# Patient Record
Sex: Male | Born: 1965 | Hispanic: Refuse to answer | Marital: Married | Smoking: Never smoker
Health system: Southern US, Community
[De-identification: ages and names within clinical notes are randomized; demographics above are authoritative.]

## PROBLEM LIST (undated history)

## (undated) DIAGNOSIS — I10 Essential (primary) hypertension: Secondary | ICD-10-CM

## (undated) DIAGNOSIS — L719 Rosacea, unspecified: Secondary | ICD-10-CM

## (undated) DIAGNOSIS — E785 Hyperlipidemia, unspecified: Secondary | ICD-10-CM

## (undated) DIAGNOSIS — J309 Allergic rhinitis, unspecified: Secondary | ICD-10-CM

## (undated) HISTORY — DX: Rosacea, unspecified: L71.9

## (undated) HISTORY — DX: Essential (primary) hypertension: I10

## (undated) HISTORY — DX: Hyperlipidemia, unspecified: E78.5

## (undated) HISTORY — PX: GUM SURGERY: SHX658

## (undated) HISTORY — DX: Allergic rhinitis, unspecified: J30.9

---

## 2004-09-12 ENCOUNTER — Ambulatory Visit (HOSPITAL_COMMUNITY): Admission: RE | Admit: 2004-09-12 | Discharge: 2004-09-12 | Payer: Self-pay | Admitting: Cardiovascular Disease

## 2012-11-24 HISTORY — PX: VASECTOMY: SHX75

## 2015-06-04 ENCOUNTER — Other Ambulatory Visit: Payer: Self-pay | Admitting: Family Medicine

## 2015-06-04 DIAGNOSIS — Z0000 Encounter for general adult medical examination without abnormal findings: Secondary | ICD-10-CM

## 2015-06-13 ENCOUNTER — Other Ambulatory Visit: Payer: Self-pay | Admitting: Family Medicine

## 2015-06-13 DIAGNOSIS — I10 Essential (primary) hypertension: Secondary | ICD-10-CM

## 2015-06-13 DIAGNOSIS — E785 Hyperlipidemia, unspecified: Secondary | ICD-10-CM

## 2015-06-15 ENCOUNTER — Encounter: Payer: Self-pay | Admitting: Family Medicine

## 2015-06-15 ENCOUNTER — Ambulatory Visit (INDEPENDENT_AMBULATORY_CARE_PROVIDER_SITE_OTHER): Payer: BC Managed Care – PPO | Admitting: Family Medicine

## 2015-06-15 VITALS — BP 116/74 | HR 94 | Temp 98.6°F | Resp 16 | Ht 66.0 in | Wt 186.8 lb

## 2015-06-15 DIAGNOSIS — I10 Essential (primary) hypertension: Secondary | ICD-10-CM | POA: Insufficient documentation

## 2015-06-15 DIAGNOSIS — Z0000 Encounter for general adult medical examination without abnormal findings: Secondary | ICD-10-CM

## 2015-06-15 DIAGNOSIS — E785 Hyperlipidemia, unspecified: Secondary | ICD-10-CM | POA: Diagnosis not present

## 2015-06-15 DIAGNOSIS — L719 Rosacea, unspecified: Secondary | ICD-10-CM | POA: Insufficient documentation

## 2015-06-15 DIAGNOSIS — J309 Allergic rhinitis, unspecified: Secondary | ICD-10-CM | POA: Insufficient documentation

## 2015-06-15 NOTE — Progress Notes (Signed)
Subjective:     Patient ID: Jesse Lara, male   DOB: 09-19-1966, 49 y.o.   MRN: [id number]  HPI  Chief Complaint  Patient presents with  . Annual Exam    patient is present in office today for his yearly exam he has no questions or concerns today.   He has already had labs drawn prior to this visit with further review today.   Review of Systems  General: Feeling well HEENT: regular dental visits and eye exams.Sees ENT, Dr. Willeen Cass, for allergy shots. Cardiovascular: no chest pain, shortness of breath, or palpitations GI: no heartburn, no change in bowel habits or blood in the stool GU: nocturia x 1, no change in bladder habits  Neuro: no change in memory Psychiatric: not depressed-PHQ-2: 0 Musculoskeletal: no joint pain Skin: sees dermatology for treatment of rosacea and annual survey.    Objective:   Physical Exam Eyes: PERRLA Neck: no thyromegaly, tenderness or nodules ENT: TM's intact without inflammation; No tonsillar enlargement or exudate, Lungs: Clear Heart : RRR without murmur or gallop Abd: bowel sounds present, soft, non-tender, no organomegaly Extremities: no edema     Assessment:    1. Annual physical exam  2. Essential hypertension-continue current medication   3. HLD (hyperlipidemia)-mild, low 10 year cardiovascular disease risk (2.5%)    Plan:    Continue daily exercise.

## 2015-06-15 NOTE — Patient Instructions (Signed)
Continue regular exercise

## 2015-06-22 ENCOUNTER — Telehealth: Payer: Self-pay | Admitting: Family Medicine

## 2015-06-22 NOTE — Telephone Encounter (Signed)
Pt is calling to give a name of a Rx/face cream-Finacea Acid foam 15%/MW

## 2015-06-22 NOTE — Telephone Encounter (Signed)
See below-aa 

## 2015-08-21 ENCOUNTER — Telehealth: Payer: Self-pay | Admitting: Family Medicine

## 2015-08-21 ENCOUNTER — Encounter: Payer: Self-pay | Admitting: Family Medicine

## 2015-08-21 ENCOUNTER — Ambulatory Visit (INDEPENDENT_AMBULATORY_CARE_PROVIDER_SITE_OTHER): Payer: BC Managed Care – PPO | Admitting: Family Medicine

## 2015-08-21 VITALS — BP 116/64 | HR 97 | Temp 98.5°F | Resp 16 | Wt 177.4 lb

## 2015-08-21 DIAGNOSIS — M545 Low back pain, unspecified: Secondary | ICD-10-CM

## 2015-08-21 MED ORDER — OXYCODONE HCL 5 MG PO TABS: 5.0000 mg | ORAL_TABLET | Freq: Four times a day (QID) | ORAL | Status: DC | PRN

## 2015-08-21 NOTE — Progress Notes (Addendum)
Subjective:     Patient ID: Jesse Lara, male   DOB: 1966/03/23, 49 y.o.   MRN: [id number]  HPI  Chief Complaint  Patient presents with  . Follow-up    Patient is present in office today for hospital follow up. Patient was seen at [facility] ER with complaints of lower back pain on the right side. CT scan patient reports did show small kidney stones but was not related to patients pain. Patient reports that he has been taking Ibuprofen/Tylenol, diazepam, and Lidoderm patches for pain with no relief.   States he was felt to have low back strain. [facility] records reviewed from 9/25-9/26/16. Reports he did do some lifting and other outside yard work prior to his pain worsening.    Review of Systems  Constitutional: Negative for fever and chills.  Musculoskeletal:       No radiation of pain or prior back injury reported.  Neurological:       Occasional mild tingling to right inner thigh area       Objective:   Physical Exam  Constitutional: He appears well-developed and well-nourished. He appears distressed (moderate pain with changing postions. Lying on his side on presentation).  Musculoskeletal:  Muscle strength in lower extremities 5/5. SLR's to 90 degrees without radiation of back pain.  localizes to his right upper buttock area. Mildly tender to the touch.     Assessment:    1. Right-sided low back pain without sciatica - oxyCODONE (ROXICODONE) 5 MG immediate release tablet; Take 1 tablet (5 mg total) by mouth every 6 (six) hours as needed for severe pain.  Dispense: 28 tablet; Refill: 0    Plan:    Discussed continued use of Ibuprofen, tylenol, and diazepam. Oxycodone provided for breakthrough pain. Work excuse for 9/26-9/30 provided.

## 2015-08-21 NOTE — Telephone Encounter (Signed)
Pt is requesting a call back.  Pt was seen in the ER at [facility] on Sunday and wants to speak to a nurse before making an appointment.  CB#[phone]/MW

## 2015-08-21 NOTE — Patient Instructions (Signed)
Continue ibuprofen (ideally with meals) and Tylenol up to 3000 mg/day. May use diazepam for muscle spasm. Use narcotic pain medication for pain not relieved by the above.

## 2015-08-21 NOTE — Telephone Encounter (Signed)
Spoke with patient on the phone who states that he was seen at [facility] on Sunday. Patient reports having back pain and Ct scan was performed it did not show any signs of a kidney stone. Patient was prescribed medication from ER and states that it is not helping him and wants to know if there was anything else that we can do, advised patient to come in office for hospital follow up for further evaluation and work up. Patient declined and states that he will be going back to [facility] to be examined again. KW

## 2015-08-27 ENCOUNTER — Ambulatory Visit (INDEPENDENT_AMBULATORY_CARE_PROVIDER_SITE_OTHER): Payer: BC Managed Care – PPO | Admitting: Family Medicine

## 2015-08-27 ENCOUNTER — Encounter: Payer: Self-pay | Admitting: Family Medicine

## 2015-08-27 VITALS — BP 116/60 | HR 90 | Temp 98.2°F | Resp 16 | Wt 174.2 lb

## 2015-08-27 DIAGNOSIS — Z0289 Encounter for other administrative examinations: Secondary | ICD-10-CM

## 2015-08-27 DIAGNOSIS — Z23 Encounter for immunization: Secondary | ICD-10-CM | POA: Diagnosis not present

## 2015-08-27 NOTE — Progress Notes (Signed)
Subjective:     Patient ID: Jesse Lara, male   DOB: 05/15/1966, 49 y.o.   MRN: [id number]  HPI Chief Complaint  Patient presents with  . Work Form    Needs form to be fill out to go back to work  States his back feels much better and he is tapering back use of medication.   Review of Systems     Objective:   Physical Exam  Constitutional: He appears well-developed and well-nourished. No distress.  Musculoskeletal:  Able to move and change positions without difficulty. SLR's to 90 degrees without back pain or radiation of pain.       Assessment:    1. Encounter for completion of form with patient  2. Need for influenza vaccinatio - Flu Vaccine QUAD 36+ mos IM    Plan:    Form completed and will be faxed today.

## 2015-08-27 NOTE — Patient Instructions (Signed)
We will fax your form today. 

## 2015-08-29 ENCOUNTER — Encounter: Payer: Self-pay | Admitting: Family Medicine

## 2015-08-30 ENCOUNTER — Other Ambulatory Visit: Payer: Self-pay | Admitting: Family Medicine

## 2015-08-30 DIAGNOSIS — I10 Essential (primary) hypertension: Secondary | ICD-10-CM

## 2015-08-30 MED ORDER — LISINOPRIL-HYDROCHLOROTHIAZIDE 20-25 MG PO TABS: 1.0000 | ORAL_TABLET | Freq: Every day | ORAL | Status: DC

## 2015-09-05 ENCOUNTER — Other Ambulatory Visit: Payer: Self-pay | Admitting: Family Medicine

## 2016-08-25 ENCOUNTER — Encounter: Payer: Self-pay | Admitting: Family Medicine

## 2016-08-25 ENCOUNTER — Ambulatory Visit (INDEPENDENT_AMBULATORY_CARE_PROVIDER_SITE_OTHER): Payer: BC Managed Care – PPO | Admitting: Family Medicine

## 2016-08-25 VITALS — BP 106/60 | HR 94 | Temp 98.3°F | Resp 16 | Ht 66.0 in | Wt 182.8 lb

## 2016-08-25 DIAGNOSIS — E785 Hyperlipidemia, unspecified: Secondary | ICD-10-CM | POA: Diagnosis not present

## 2016-08-25 DIAGNOSIS — I10 Essential (primary) hypertension: Secondary | ICD-10-CM

## 2016-08-25 MED ORDER — LISINOPRIL-HYDROCHLOROTHIAZIDE 20-25 MG PO TABS
1.0000 | ORAL_TABLET | Freq: Every day | ORAL | 3 refills | Status: DC
Start: 2016-08-25 — End: 2017-08-10

## 2016-08-25 NOTE — Progress Notes (Signed)
Subjective:     Patient ID: Jesse Lara, male   DOB: 09/07/1966, 50 y.o.   MRN: [id number]  HPI  Chief Complaint  Patient presents with  . Annual Exam    Patient comes in office today for his annual physical he states that he has no questions or conerns toay. Patients last reported Tdap 08/28/22 he is due today for flu vaccine but has declined.   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 Continues to walk 4 days a week for about an hour. States he developed cold sx about 3 days ago.   Review of Systems General: Feeling well except for current URI. HEENT: regular dental visits and eye exams Cardiovascular: no chest pain, shortness of breath, or palpitations GI: no heartburn, no change in bowel habits or blood in the stool GU: nocturia x 0, no change in bladder habits  Psychiatric: not depressed Musculoskeletal: no joint pain    Objective:   Physical Exam  Constitutional: He appears well-developed and well-nourished. No distress.  Eyes: PERRLA Neck: no thyromegaly, tenderness or nodules, no cervical adenopathy ENT: TM's intact without inflammation; No tonsillar enlargement or exudate, Lungs: Clear Heart : RRR without murmur or gallop Abd: bowel sounds present, soft, non-tender, no organomegaly Extremities: no edema     Assessment:    1. Essential hypertension - Comprehensive metabolic panel - lisinopril-hydrochlorothiazide (PRINZIDE,ZESTORETIC) 20-25 MG tablet; Take 1 tablet by mouth daily.  Dispense: 90 tablet; Refill: 3  2. Hyperlipidemia, unspecified hyperlipidemia type - Lipid panel    Plan:    Further f/u pending lab work. Continue walking program

## 2016-08-25 NOTE — Patient Instructions (Signed)
We will call you with the lab results. Continue walking program.

## 2016-08-26 ENCOUNTER — Telehealth: Payer: Self-pay

## 2016-08-26 LAB — COMPREHENSIVE METABOLIC PANEL
ALT: 42 IU/L (ref 0–44)
AST: 25 IU/L (ref 0–40)
Albumin/Globulin Ratio: 1.6 (ref 1.2–2.2)
Albumin: 4.6 g/dL (ref 3.5–5.5)
Alkaline Phosphatase: 88 IU/L (ref 39–117)
BUN/Creatinine Ratio: 9 (ref 9–20)
BUN: 8 mg/dL (ref 6–24)
Bilirubin Total: 1 mg/dL (ref 0.0–1.2)
CO2: 24 mmol/L (ref 18–29)
Calcium: 9.7 mg/dL (ref 8.7–10.2)
Chloride: 95 mmol/L — ABNORMAL LOW (ref 96–106)
Creatinine, Ser: 0.9 mg/dL (ref 0.76–1.27)
GFR calc Af Amer: 116 mL/min/{1.73_m2} (ref >59)
GFR calc non Af Amer: 101 mL/min/{1.73_m2} (ref >59)
Globulin, Total: 2.9 g/dL (ref 1.5–4.5)
Glucose: 101 mg/dL — ABNORMAL HIGH (ref 65–99)
Potassium: 4.3 mmol/L (ref 3.5–5.2)
Sodium: 137 mmol/L (ref 134–144)
Total Protein: 7.5 g/dL (ref 6.0–8.5)

## 2016-08-26 LAB — LIPID PANEL
Chol/HDL Ratio: 5.1 ratio units — ABNORMAL HIGH (ref 0.0–5.0)
Cholesterol, Total: 185 mg/dL (ref 100–199)
HDL: 36 mg/dL — ABNORMAL LOW (ref >39)
LDL Calculated: 122 mg/dL — ABNORMAL HIGH (ref 0–99)
Triglycerides: 134 mg/dL (ref 0–149)
VLDL Cholesterol Cal: 27 mg/dL (ref 5–40)

## 2016-08-26 NOTE — Telephone Encounter (Signed)
-----   Message from Anola Gurneyobert Chauvin, [place] sent at 08/26/2016  7:39 AM EDT ----- Labs ok. Your calculated 10 year risk for developing cardiovascular disease is 3%. We usually will recommend cholesterol lowering drugs at a 7.5% or greater.

## 2016-08-26 NOTE — Telephone Encounter (Signed)
Pt advised.   Thanks,   -Barbera Perritt  

## 2016-09-01 ENCOUNTER — Encounter: Payer: Self-pay | Admitting: Physician Assistant

## 2016-09-01 ENCOUNTER — Ambulatory Visit (INDEPENDENT_AMBULATORY_CARE_PROVIDER_SITE_OTHER): Payer: BC Managed Care – PPO | Admitting: Physician Assistant

## 2016-09-01 VITALS — BP 118/64 | HR 96 | Temp 98.2°F | Resp 16 | Wt 179.0 lb

## 2016-09-01 DIAGNOSIS — J40 Bronchitis, not specified as acute or chronic: Secondary | ICD-10-CM

## 2016-09-01 DIAGNOSIS — R05 Cough: Secondary | ICD-10-CM | POA: Diagnosis not present

## 2016-09-01 DIAGNOSIS — R059 Cough, unspecified: Secondary | ICD-10-CM

## 2016-09-01 MED ORDER — BENZONATATE 100 MG PO CAPS
100.0000 mg | ORAL_CAPSULE | Freq: Three times a day (TID) | ORAL | 0 refills | Status: DC | PRN
Start: 2016-09-01 — End: 2016-09-10

## 2016-09-01 NOTE — Progress Notes (Signed)
[facility]  Chief Complaint  Patient presents with  . Cough    Subjective:    Patient ID: Jesse Lara, male    DOB: 03-20-1966, 50 y.o.   MRN: [id number]  Upper Respiratory Infection: Jesse Lara is a  50 y.o. male with a past medical history significant for Hypertension, Hyperlipidemia, and Allergic Rhinitis complaining of symptoms of a URI. Symptoms include cough. Onset of symptoms was 1 week ago, unchanged since that time. He also c/o congestion, headache described as a sinus headache, nasal congestion, post nasal drip, productive cough with  yellow colored sputum and purulent nasal discharge for the past 1 week .  He is drinking plenty of fluids. Evaluation to date: none. Treatment to date: Pt reports having "old Augmentin" that he is taking from home. . The treatment has provided minimal.   Review of Systems  Constitutional: Positive for fatigue. Negative for activity change, appetite change, chills, diaphoresis, fever and unexpected weight change.  HENT: Positive for congestion, ear pain (ear fullness), postnasal drip, rhinorrhea, sinus pressure and sore throat. Negative for ear discharge, facial swelling, nosebleeds, sneezing, tinnitus, trouble swallowing and voice change.   Eyes: Negative.   Respiratory: Positive for cough and wheezing. Negative for apnea, choking, chest tightness, shortness of breath and stridor.   Cardiovascular: Negative.   Gastrointestinal: Negative.   Neurological: Positive for headaches. Negative for dizziness and light-headedness.   Patient reports that he took some leftover Augmentin 500/125 BID for six days prior to coming. He reports that his sinus symptoms have improved, but he has lingering post nasal drip and a cough that keeps him up at night. Denies fever, nausea, vomiting.     Objective:   BP 118/64 (BP Location: Left Arm, Patient Position: Sitting, Cuff Size: Normal)   Pulse 96   Temp 98.2 F (36.8 C) (Oral)   Resp 16   Wt  179 lb (81.2 kg)   SpO2 94%   BMI 28.89 kg/m   Patient Active Problem List   Diagnosis Date Noted  . Allergic rhinitis 06/15/2015  . BP (high blood pressure) 06/15/2015  . HLD (hyperlipidemia) 06/15/2015  . Rosacea 06/15/2015    Outpatient Encounter Prescriptions as of 09/01/2016  Medication Sig Note  . lisinopril-hydrochlorothiazide (PRINZIDE,ZESTORETIC) 20-25 MG tablet Take 1 tablet by mouth daily.   . MULTIPLE VITAMIN PO Take by mouth. 06/15/2015: Received from: [facility]  . OMEGA-3 FATTY ACIDS PO Take by mouth. 06/15/2015: Received from: [facility]  . benzonatate (TESSALON) 100 MG capsule Take 1 capsule (100 mg total) by mouth 3 (three) times daily as needed for cough.    No facility-administered encounter medications on file as of 09/01/2016.     Allergies  Allergen Reactions  . Codeine Nausea Only       Physical Exam  Constitutional: He is oriented to person, place, and time. Vital signs are normal. He appears well-developed and well-nourished. He does not appear ill. No distress.  HENT:  Right Ear: Tympanic membrane and external ear normal.  Left Ear: Tympanic membrane and external ear normal.  Nose: Rhinorrhea present. Right sinus exhibits no maxillary sinus tenderness and no frontal sinus tenderness. Left sinus exhibits no maxillary sinus tenderness and no frontal sinus tenderness.  Mouth/Throat: No oropharyngeal exudate.  Eyes: Right eye exhibits discharge. Left eye exhibits discharge.  Watery eye discharge.   Cardiovascular: Normal rate, regular rhythm and normal heart sounds.   Pulmonary/Chest: Effort normal and breath sounds normal. No respiratory distress. He  has no wheezes. He has no rales.  Lymphadenopathy:    He has cervical adenopathy.  Neurological: He is alert and oriented to person, place, and time.  Skin: Skin is warm and dry. He is not diaphoretic.  Psychiatric: He has a normal mood and affect. His behavior is  normal.       Assessment & Plan:   Problem List Items Addressed This Visit    None    Visit Diagnoses    Cough    -  Primary   Relevant Medications   benzonatate (TESSALON) 100 MG capsule   Bronchitis          Antibiotics are not appropriate at this time. Patient has improvement on sinus symptoms and is not concerning for pneumonia 2/2 normal respiratory effort, clear lung sounds, lack of systemic signs. Patient counseled that cough and post nasal drip can linger. Counseled to take coricidin for congestion. Prescribed Tessalon perles for cough. May take Benadryl at night. Counseled patient to return if fever > 100.4, nausea, vomiting, myalgias, productive cough worsens.  Recommend rest, fluids, frequent hand washing. Work note provided  Patient Instructions  Acute Bronchitis Bronchitis is inflammation of the airways that extend from the windpipe into the lungs (bronchi). The inflammation often causes mucus to develop. This leads to a cough, which is the most common symptom of bronchitis.  In acute bronchitis, the condition usually develops suddenly and goes away over time, usually in a couple weeks. Smoking, allergies, and asthma can make bronchitis worse. Repeated episodes of bronchitis may cause further lung problems.  CAUSES Acute bronchitis is most often caused by the same virus that causes a cold. The virus can spread from person to person (contagious) through coughing, sneezing, and touching contaminated objects. SIGNS AND SYMPTOMS   Cough.   Fever.   Coughing up mucus.   Body aches.   Chest congestion.   Chills.   Shortness of breath.   Sore throat.  DIAGNOSIS  Acute bronchitis is usually diagnosed through a physical exam. Your health care provider will also ask you questions about your medical history. Tests, such as chest X-rays, are sometimes done to rule out other conditions.  TREATMENT  Acute bronchitis usually goes away in a couple weeks.  Oftentimes, no medical treatment is necessary. Medicines are sometimes given for relief of fever or cough. Antibiotic medicines are usually not needed but may be prescribed in certain situations. In some cases, an inhaler may be recommended to help reduce shortness of breath and control the cough. A cool mist vaporizer may also be used to help thin bronchial secretions and make it easier to clear the chest.  HOME CARE INSTRUCTIONS  Get plenty of rest.   Drink enough fluids to keep your urine clear or pale yellow (unless you have a medical condition that requires fluid restriction). Increasing fluids may help thin your respiratory secretions (sputum) and reduce chest congestion, and it will prevent dehydration.   Take medicines only as directed by your health care provider.  If you were prescribed an antibiotic medicine, finish it all even if you start to feel better.  Avoid smoking and secondhand smoke. Exposure to cigarette smoke or irritating chemicals will make bronchitis worse. If you are a smoker, consider using nicotine gum or skin patches to help control withdrawal symptoms. Quitting smoking will help your lungs heal faster.   Reduce the chances of another bout of acute bronchitis by washing your hands frequently, avoiding people with cold symptoms, and trying not  to touch your hands to your mouth, nose, or eyes.   Keep all follow-up visits as directed by your health care provider.  SEEK MEDICAL CARE IF: Your symptoms do not improve after 1 week of treatment.  SEEK IMMEDIATE MEDICAL CARE IF:  You develop an increased fever or chills.   You have chest pain.   You have severe shortness of breath.  You have bloody sputum.   You develop dehydration.  You faint or repeatedly feel like you are going to pass out.  You develop repeated vomiting.  You develop a severe headache. MAKE SURE YOU:   Understand these instructions.  Will watch your condition.  Will get help  right away if you are not doing well or get worse.   This information is not intended to replace advice given to you by your health care provider. Make sure you discuss any questions you have with your health care provider.   Document Released: 12/18/2004 Document Revised: 12/01/2014 Document Reviewed: 05/03/2013 Elsevier Interactive Patient Education [employer]     The entirety of the information documented in the History of Present Illness, Review of Systems and Physical Exam were personally obtained by me. Portions of this information were initially documented by Kavin Leech, CMA and reviewed by me for thoroughness and accuracy.

## 2016-09-01 NOTE — Patient Instructions (Signed)

## 2016-09-10 ENCOUNTER — Encounter: Payer: Self-pay | Admitting: Family Medicine

## 2016-09-10 ENCOUNTER — Ambulatory Visit (INDEPENDENT_AMBULATORY_CARE_PROVIDER_SITE_OTHER): Payer: BC Managed Care – PPO | Admitting: Family Medicine

## 2016-09-10 VITALS — BP 124/70 | HR 98 | Temp 98.4°F | Resp 17 | Wt 181.8 lb

## 2016-09-10 DIAGNOSIS — B9789 Other viral agents as the cause of diseases classified elsewhere: Secondary | ICD-10-CM | POA: Diagnosis not present

## 2016-09-10 DIAGNOSIS — J069 Acute upper respiratory infection, unspecified: Secondary | ICD-10-CM

## 2016-09-10 MED ORDER — CEFDINIR 300 MG PO CAPS: 300.0000 mg | ORAL_CAPSULE | Freq: Two times a day (BID) | ORAL | 0 refills | Status: DC

## 2016-09-10 NOTE — Patient Instructions (Signed)
Continue Zyrtec and Neti Pot. Get Tessalon Perles for cough. Add Benadryl (diphenhydramine) at night. If increased yellow/green drainage start antibiotic.

## 2016-09-10 NOTE — Progress Notes (Signed)
Subjective:     Patient ID: Jesse Lara, male   DOB: 01/25/1966, 50 y.o.   MRN: [id number]  HPI  Chief Complaint  Patient presents with  . Nasal Congestion    Patient comes in office today with concerns of post nasal drainage since 08/22/16 patient states that drip is constant and has caused him to have a productive cough. Patient also reports sore throat, he states that he has been taking otc cough drops and Zyrtec.   He is two weeks out from start of URI sx. Did not get Tessalon Perles. Has been using Zyrtec and a Neti Pot. Has had primarily clear drainage but notes residual yellow drainage at times. States his kids are sick as well.   Review of Systems     Objective:   Physical Exam  Constitutional: He appears well-developed and well-nourished. No distress.  Ears: T.M's intact without inflammation Throat: no tonsillar enlargement or exudate Neck: no cervical adenopathy Lungs: clear     Assessment:    1. Viral upper respiratory tract infection - cefdinir (OMNICEF) 300 MG capsule; Take 1 capsule (300 mg total) by mouth 2 (two) times daily.  Dispense: 20 capsule; Refill: 0     Plan:    Continue Zyrtec and Neti pot. Add Benadryl at night and Tessalon Perles. Start abx if Increased purulent drainage.

## 2016-09-26 ENCOUNTER — Encounter: Payer: Self-pay | Admitting: Family Medicine

## 2016-09-26 ENCOUNTER — Telehealth: Payer: Self-pay

## 2016-09-26 NOTE — Telephone Encounter (Signed)
error 

## 2016-10-07 ENCOUNTER — Encounter: Payer: Self-pay | Admitting: Family Medicine

## 2016-10-07 ENCOUNTER — Other Ambulatory Visit: Payer: Self-pay | Admitting: Family Medicine

## 2016-10-07 DIAGNOSIS — Z125 Encounter for screening for malignant neoplasm of prostate: Secondary | ICD-10-CM

## 2016-10-09 ENCOUNTER — Other Ambulatory Visit: Payer: Self-pay | Admitting: Family Medicine

## 2016-10-10 ENCOUNTER — Telehealth: Payer: Self-pay

## 2016-10-10 ENCOUNTER — Encounter: Payer: Self-pay | Admitting: Family Medicine

## 2016-10-10 LAB — PSA: Prostate Specific Ag, Serum: 0.5 ng/mL (ref 0.0–4.0)

## 2016-10-10 NOTE — Telephone Encounter (Signed)
-----   Message from Anola Gurneyobert Chauvin, [place] sent at 10/10/2016  7:32 AM EST ----- Your PSA is ok. You can wait until 52 to recheck.

## 2016-10-10 NOTE — Telephone Encounter (Signed)
[id number] 

## 2016-10-13 NOTE — Telephone Encounter (Signed)
Patient has been advised. KW 

## 2016-10-13 NOTE — Telephone Encounter (Signed)
[id number] 

## 2017-03-02 ENCOUNTER — Encounter: Payer: Self-pay | Admitting: Family Medicine

## 2017-03-16 ENCOUNTER — Encounter: Payer: Self-pay | Admitting: Family Medicine

## 2017-03-19 ENCOUNTER — Ambulatory Visit (INDEPENDENT_AMBULATORY_CARE_PROVIDER_SITE_OTHER): Payer: BC Managed Care – PPO | Admitting: Family Medicine

## 2017-03-19 ENCOUNTER — Encounter: Payer: Self-pay | Admitting: Family Medicine

## 2017-03-19 VITALS — BP 122/84 | HR 80 | Temp 98.4°F | Resp 16 | Wt 187.6 lb

## 2017-03-19 DIAGNOSIS — S61219A Laceration without foreign body of unspecified finger without damage to nail, initial encounter: Secondary | ICD-10-CM

## 2017-03-19 NOTE — Progress Notes (Addendum)
Subjective:     Patient ID: Jesse Lara, male   DOB: 09/29/1966, 51 y.o.   MRN: [id number]  HPI  Chief Complaint  Patient presents with  . Laceration    Patient comes in office today with concerns of possible infection to wound. Patient states that on 03/14/17 he had scrapped his thumb [place] piece of [place]. Last reported Tdap was 08/29/2011.  Has a skin flap type laceration and has replaced the flap over the wound. Initially used hydrogen peroxide but has since been applying Neosporin abx and band aid. No drainage.   Review of Systems     Objective:   Physical Exam  Constitutional: He appears well-developed and well-nourished. No distress.  Skin:  Dorsum of left thumb with 1 cm dusky appearing skin flap laceration. Minimal tenderness without drainage or underlying induration on palpation.       Assessment:    1. Laceration of skin of finger, initial encounter: suspect non-viable skin flap     Plan:    Discussed cleaning with soap and water daily and applying abx ointment and bandaid as he has been doing (re-applied in the office). May debride flap if dry or dark. Call if purulent drainage or non-healing.

## 2017-03-19 NOTE — Patient Instructions (Signed)
Discussed cleansing wound with soap and water and apply antibiotic ointment and bandaid. You may cut off any tissue that does not appear viable (dried out/dark).

## 2017-04-24 ENCOUNTER — Encounter: Payer: Self-pay | Admitting: Family Medicine

## 2017-06-16 ENCOUNTER — Encounter: Payer: Self-pay | Admitting: Family Medicine

## 2017-06-30 ENCOUNTER — Encounter: Payer: Self-pay | Admitting: Family Medicine

## 2017-07-06 ENCOUNTER — Encounter: Payer: Self-pay | Admitting: Family Medicine

## 2017-07-13 ENCOUNTER — Encounter: Payer: Self-pay | Admitting: Family Medicine

## 2017-08-10 ENCOUNTER — Ambulatory Visit (INDEPENDENT_AMBULATORY_CARE_PROVIDER_SITE_OTHER): Payer: BC Managed Care – PPO | Admitting: Family Medicine

## 2017-08-10 ENCOUNTER — Encounter: Payer: Self-pay | Admitting: Family Medicine

## 2017-08-10 VITALS — BP 138/78 | HR 64 | Temp 98.8°F | Resp 16 | Ht 66.0 in | Wt 151.0 lb

## 2017-08-10 DIAGNOSIS — E782 Mixed hyperlipidemia: Secondary | ICD-10-CM

## 2017-08-10 DIAGNOSIS — Z0000 Encounter for general adult medical examination without abnormal findings: Secondary | ICD-10-CM

## 2017-08-10 LAB — COMPLETE METABOLIC PANEL WITH GFR
AG Ratio: 2.2 (calc) (ref 1.0–2.5)
ALT: 26 U/L (ref 9–46)
AST: 19 U/L (ref 10–40)
Albumin: 4.6 g/dL (ref 3.6–5.1)
Alkaline phosphatase (APISO): 76 U/L (ref 40–115)
BUN: 12 mg/dL (ref 7–25)
CO2: 30 mmol/L (ref 20–32)
Calcium: 9.4 mg/dL (ref 8.6–10.3)
Chloride: 106 mmol/L (ref 98–110)
Creat: 0.83 mg/dL (ref 0.60–1.35)
GFR, Est African American: 120 mL/min/{1.73_m2} (ref >=60)
GFR, Est Non African American: 103 mL/min/{1.73_m2} (ref >=60)
Globulin: 2.1 g/dL (calc) (ref 1.9–3.7)
Glucose, Bld: 91 mg/dL (ref 65–99)
Potassium: 4 mmol/L (ref 3.5–5.3)
Sodium: 142 mmol/L (ref 135–146)
Total Bilirubin: 1 mg/dL (ref 0.2–1.2)
Total Protein: 6.7 g/dL (ref 6.1–8.1)

## 2017-08-10 LAB — LIPID PANEL
Cholesterol: 146 mg/dL (ref <200)
HDL: 48 mg/dL (ref >40)
LDL Cholesterol (Calc): 84 mg/dL (calc)
Non-HDL Cholesterol (Calc): 98 mg/dL (calc) (ref <130)
Total CHOL/HDL Ratio: 3 (calc) (ref <5.0)
Triglycerides: 64 mg/dL (ref <150)
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
## 2017-08-10 NOTE — Patient Instructions (Signed)
We will call you with the lab results. Continue with your impressive lifestyle changes. Let me know if your blood pressure approaches 140/90. Come in to recheck your blood pressure in 6 months

## 2017-09-24 ENCOUNTER — Encounter: Payer: Self-pay | Admitting: Family Medicine

## 2017-09-25 ENCOUNTER — Other Ambulatory Visit: Payer: Self-pay | Admitting: Family Medicine

## 2017-09-25 ENCOUNTER — Telehealth: Payer: Self-pay | Admitting: Family Medicine

## 2017-09-25 ENCOUNTER — Encounter: Payer: Self-pay | Admitting: Family Medicine

## 2017-09-25 MED ORDER — LISINOPRIL 10 MG PO TABS: 10.0000 mg | ORAL_TABLET | Freq: Every day | ORAL | 0 refills | Status: DC

## 2017-09-25 NOTE — Telephone Encounter (Signed)
Pt is requesting Nadine CountsBob return his call. Pt stated he would like to discuss his BP with Nadine CountsBob. Pt wouldn't give more details. Pt stated that Nadine CountsBob would know because he has been a pt of Bob's for 20 years. Please advise. Thanks TNP

## 2017-09-25 NOTE — Telephone Encounter (Signed)
Please review-Jesse Lara Jesse Lara, RMA  

## 2017-09-25 NOTE — Telephone Encounter (Signed)
Blood pressure elevated despite significant exercise and weight loss Will send in lisinopril 10 mg. With further f/u in two weeks.

## 2017-10-20 ENCOUNTER — Encounter: Payer: Self-pay | Admitting: Family Medicine

## 2017-10-20 ENCOUNTER — Other Ambulatory Visit: Payer: Self-pay | Admitting: Family Medicine

## 2017-10-20 MED ORDER — LISINOPRIL 10 MG PO TABS: 10.0000 mg | ORAL_TABLET | Freq: Every day | ORAL | 1 refills | Status: DC

## 2017-12-24 ENCOUNTER — Ambulatory Visit: Payer: BC Managed Care – PPO | Admitting: Family Medicine

## 2017-12-24 ENCOUNTER — Encounter: Payer: Self-pay | Admitting: Family Medicine

## 2017-12-24 ENCOUNTER — Other Ambulatory Visit: Payer: Self-pay

## 2017-12-24 ENCOUNTER — Emergency Department: Payer: BC Managed Care – PPO

## 2017-12-24 ENCOUNTER — Encounter: Payer: Self-pay | Admitting: Emergency Medicine

## 2017-12-24 ENCOUNTER — Emergency Department
Admission: EM | Admit: 2017-12-24 | Discharge: 2017-12-24 | Disposition: A | Discharge status: Other Acute Inpatient Hospital | Payer: BC Managed Care – PPO | Attending: Emergency Medicine | Admitting: Emergency Medicine

## 2017-12-24 VITALS — BP 122/80 | HR 64 | Temp 97.8°F | Resp 16 | Wt 144.0 lb

## 2017-12-24 DIAGNOSIS — S0990XA Unspecified injury of head, initial encounter: Secondary | ICD-10-CM | POA: Diagnosis present

## 2017-12-24 DIAGNOSIS — Y92239 Unspecified place in hospital as the place of occurrence of the external cause: Secondary | ICD-10-CM | POA: Insufficient documentation

## 2017-12-24 DIAGNOSIS — S065XAA Traumatic subdural hemorrhage with loss of consciousness status unknown, initial encounter: Secondary | ICD-10-CM

## 2017-12-24 DIAGNOSIS — R1032 Left lower quadrant pain: Secondary | ICD-10-CM | POA: Diagnosis not present

## 2017-12-24 DIAGNOSIS — Z79899 Other long term (current) drug therapy: Secondary | ICD-10-CM | POA: Diagnosis not present

## 2017-12-24 DIAGNOSIS — Y998 Other external cause status: Secondary | ICD-10-CM | POA: Insufficient documentation

## 2017-12-24 DIAGNOSIS — S065X9A Traumatic subdural hemorrhage with loss of consciousness of unspecified duration, initial encounter: Secondary | ICD-10-CM | POA: Insufficient documentation

## 2017-12-24 DIAGNOSIS — Y939 Activity, unspecified: Secondary | ICD-10-CM | POA: Insufficient documentation

## 2017-12-24 DIAGNOSIS — S020XXA Fracture of vault of skull, initial encounter for closed fracture: Secondary | ICD-10-CM | POA: Diagnosis not present

## 2017-12-24 DIAGNOSIS — I10 Essential (primary) hypertension: Secondary | ICD-10-CM | POA: Insufficient documentation

## 2017-12-24 DIAGNOSIS — R4182 Altered mental status, unspecified: Secondary | ICD-10-CM | POA: Diagnosis not present

## 2017-12-24 DIAGNOSIS — W07XXXA Fall from chair, initial encounter: Secondary | ICD-10-CM | POA: Insufficient documentation

## 2017-12-24 DIAGNOSIS — Z23 Encounter for immunization: Secondary | ICD-10-CM | POA: Diagnosis not present

## 2017-12-24 LAB — COMPREHENSIVE METABOLIC PANEL
ALT: 45 U/L (ref 17–63)
AST: 46 U/L — ABNORMAL HIGH (ref 15–41)
Albumin: 4.2 g/dL (ref 3.5–5.0)
Alkaline Phosphatase: 92 U/L (ref 38–126)
Anion gap: 8 (ref 5–15)
BUN: 14 mg/dL (ref 6–20)
CO2: 26 mmol/L (ref 22–32)
Calcium: 8.5 mg/dL — ABNORMAL LOW (ref 8.9–10.3)
Chloride: 103 mmol/L (ref 101–111)
Creatinine, Ser: 0.89 mg/dL (ref 0.61–1.24)
GFR calc Af Amer: >60 mL/min (ref >60)
GFR calc non Af Amer: >60 mL/min (ref >60)
Glucose, Bld: 201 mg/dL — ABNORMAL HIGH (ref 65–99)
Potassium: 3.7 mmol/L (ref 3.5–5.1)
Sodium: 137 mmol/L (ref 135–145)
Total Bilirubin: 1 mg/dL (ref 0.3–1.2)
Total Protein: 7.2 g/dL (ref 6.5–8.1)

## 2017-12-24 LAB — CBC
HCT: 45.3 % (ref 40.0–52.0)
Hemoglobin: 15.3 g/dL (ref 13.0–18.0)
MCH: 29.9 pg (ref 26.0–34.0)
MCHC: 33.8 g/dL (ref 32.0–36.0)
MCV: 88.3 fL (ref 80.0–100.0)
Platelets: 240 10*3/uL (ref 150–440)
RBC: 5.14 MIL/uL (ref 4.40–5.90)
RDW: 13.8 % (ref 11.5–14.5)
WBC: 9.5 10*3/uL (ref 3.8–10.6)

## 2017-12-24 LAB — TROPONIN I: Troponin I: <0.03 ng/mL (ref <0.03)

## 2017-12-24 MED ORDER — SODIUM CHLORIDE 0.9 % IV SOLN
1000.0000 mL | Freq: Once | INTRAVENOUS | Status: AC
  Administered 2017-12-24: 1000 mL via INTRAVENOUS

## 2017-12-24 MED ORDER — SODIUM CHLORIDE 0.9 % IV SOLN
750.0000 mg | Freq: Once | INTRAVENOUS | Status: AC
  Administered 2017-12-24: 750 mg via INTRAVENOUS
  Filled 2017-12-24: qty 7.5

## 2017-12-24 NOTE — ED Triage Notes (Signed)
Pt arrives via [facility] from [place] s/p falling off tall stool. Pt hit right side of head. Wife denies any history of episodes like this happening in past. Unable to state current month or president on first try. Accurately states name and date of birth. Stating he "feels weird" in his head.   Hx HTN. Lost 46 pounds since May.

## 2017-12-24 NOTE — Progress Notes (Signed)
Subjective:     Patient ID: Jesse Lara, male   DOB: 01/24/1966, 52 y.o.   MRN: [id number] Chief Complaint  Patient presents with  . Groin Swelling    Patient comes into office today with concerns of a possible lump and or swelling on the left side of his groin for the past 1-2 weeks. Patient reports pain when coughing and states that he has concerns that he might have a hernia.    HPI States he continues to run for exercise with no groin pain. Reports it only hurts when he has a spell of coughing. No prior hx of hernia. Continues to work as a Engineer, drillingprobation officer.  Review of Systems     Objective:   Physical Exam  Constitutional: He appears well-developed and well-nourished. No distress.  Abdominal:  No bulge or tenderness on palpation of his left groin area. Hernia check on the left consistent with sliding hernia.       Assessment:    1. Need for influenza vaccination - Flu Vaccine QUAD 36+ mos IM  2. Lt groin pain: evaluate for possible hernia - Ambulatory referral to General Surgery    Plan:    Minimize lifting. We will call about the referral

## 2017-12-24 NOTE — ED Provider Notes (Signed)
[facility] Emergency Department Provider Note   ____________________________________________    I have reviewed the triage vital signs and the nursing notes.   HISTORY  Chief Complaint Altered Mental Status     HPI Michel BickersCadle W Excell SeltzerCooper is a 52 y.o. male who presents with confusion after a fall.  Reportedly the patient was at the [place] sitting on a stool when he either lost his balance or syncopized and fell to the floor.  Patient does not remember the event, upon arrival he does not remember what happened this morning although his memory gradually seems to be improving.  Denies neck pain chest pain palpitations shortness of breath.  Not on blood thinners.  His wife reports that he has had an upper respiratory infection recently got a flu shot this morning at his PCP has not eaten breakfast.  No history of syncopal episodes in the past.  No history of seizures.   Past Medical History:  Diagnosis Date  . Allergic rhinitis   . Hyperlipidemia   . Hypertension   . Rosacea     Patient Active Problem List   Diagnosis Date Noted  . Allergic rhinitis 06/15/2015  . HLD (hyperlipidemia) 06/15/2015  . Rosacea 06/15/2015    Past Surgical History:  Procedure Laterality Date  . GUM SURGERY    . VASECTOMY  2014    Prior to Admission medications   Medication Sig Start Date End Date Taking? Authorizing Provider  cetirizine (ZYRTEC) 10 MG tablet Take 10 mg by mouth daily.    [provider]  lisinopril (PRINIVIL,ZESTRIL) 10 MG tablet Take 1 tablet (10 mg total) by mouth daily. 10/20/17   Anola Gurneyhauvin, Massa Pe, PA  MULTIPLE VITAMIN PO Take by mouth. 08/29/11   [provider]  OMEGA-3 FATTY ACIDS PO Take by mouth. 08/29/11   [provider]     Allergies Codeine  Family History  Problem Relation Age of Onset  . Hypertension Mother   . Heart attack Maternal Grandfather     Social History Social History   Tobacco Use  .  Smoking status: Never Smoker  . Smokeless tobacco: Never Used  Substance Use Topics  . Alcohol use: No    Alcohol/week: 0.0 oz    Frequency: Never  . Drug use: No    Review of Systems  Constitutional: No dizziness Eyes: No visual changes.  ENT: No neck pain Cardiovascular: Denies chest pain. Respiratory: Denies shortness of breath. Gastrointestinal:  No nausea, no vomiting.   Genitourinary: Negative for hematuria Musculoskeletal: Negative for neck pain Skin: Negative for abrasion or laceration Neurological: Right side of the head hurts, no focal weakness   ____________________________________________   PHYSICAL EXAM:  VITAL SIGNS: ED Triage Vitals  Enc Vitals Group     BP 12/24/17 1130 (!) 154/83     Pulse Rate 12/24/17 1130 89     Resp 12/24/17 1130 19     Temp 12/24/17 1134 (!) 97.5 F (36.4 C)     Temp Source 12/24/17 1134 Oral     SpO2 12/24/17 1130 100 %     Weight 12/24/17 1136 65.3 kg (144 lb)     Height 12/24/17 1136 1.702 m (5\' 7" )     Head Circumference --      Peak Flow --      Pain Score 12/24/17 1135 8     Pain Loc --      Pain Edu? --      Excl. in GC? --  Constitutional: Alert but disoriented, thinks Felix Pacini is the president Eyes: PERRLA, EOMI Head: Mild tenderness over the right parietal area Nose: No congestion/rhinnorhea. Mouth/Throat: Mucous membranes are moist.   Neck:  Painless ROM, no vertebral tenderness to palpation Cardiovascular: Normal rate, regular rhythm. Grossly normal heart sounds.  Good peripheral circulation. Respiratory: Normal respiratory effort.  No retractions. Lungs CTAB. Gastrointestinal: Soft and nontender. No distention.   Genitourinary: deferred Musculoskeletal: Back: No vertebral tenderness to palpation, normal strength in all extremities.  All extremities warm and well perfused Neurologic: No focal weakness, apparent short-term memory loss, cranial nerves II through XII are intact Skin:  Skin is warm, dry  and intact.  Psychiatric: Mood and affect are normal. Speech and behavior are normal.  ____________________________________________   LABS (all labs ordered are listed, but only abnormal results are displayed)  Labs Reviewed  COMPREHENSIVE METABOLIC PANEL - Abnormal; Notable for the following components:      Result Value   Glucose, Bld 201 (*)    Calcium 8.5 (*)    AST 46 (*)    All other components within normal limits  CBC  TROPONIN I   ____________________________________________  EKG  ED ECG REPORT I, Jene Every, the attending physician, personally viewed and interpreted this ECG.  Date: 12/24/2017  Rate: 88 Rhythm: normal sinus rhythm QRS Axis: normal Intervals: normal ST/T Wave abnormalities: normal   ____________________________________________  RADIOLOGY  CT head: Called by radiologist and notified of right sided skull fracture with likely small subdural hematoma ____________________________________________   PROCEDURES  Procedure(s) performed: No  Procedures   Critical Care performed: yes  CRITICAL CARE Performed by: Jene Every   Total critical care time: 35 minutes  Critical care time was exclusive of separately billable procedures and treating other patients.  Critical care was necessary to treat or prevent imminent or life-threatening deterioration.  Critical care was time spent personally by me on the following activities: development of treatment plan with patient and/or surrogate as well as nursing, discussions with consultants, evaluation of patient's response to treatment, examination of patient, obtaining history from patient or surrogate, ordering and performing treatments and interventions, ordering and review of laboratory studies, ordering and review of radiographic studies, pulse oximetry and re-evaluation of patient's condition.  ____________________________________________   INITIAL IMPRESSION / ASSESSMENT AND PLAN / ED  COURSE  Pertinent labs & imaging results that were available during my care of the patient were reviewed by me and considered in my medical decision making (see chart for details).  Patient with likely syncopal episode presents with head injury and amnesia.  CT scan consistent with right-sided skull fracture with likely small subdural hematoma    Discussed with Dr. Teola Bradley of neurosurgery who recommends transfer [facility]tertiary center for observation as well as 750 mg of IV Keppra  ----------------------------------------- 1:56 PM on 12/24/2017 -----------------------------------------  In the process of arranging transfer to [facility] when the patient requested to go to [facility]  Spoke to [facility] transfer center and they have accepted ED to ED transfer, Dr. Clinton Sawyer is the accepting physician.    ____________________________________________   FINAL CLINICAL IMPRESSION(S) / ED DIAGNOSES  Final diagnoses:  Closed fracture of parietal bone, initial encounter (HCC)  Subdural hematoma (HCC)        Note:  This document was prepared using Dragon voice recognition software and may include unintentional dictation errors.    Jene Every, MD 12/24/17 [phone]

## 2017-12-24 NOTE — Patient Instructions (Signed)
Minimize lifting. You will get a call about the referral.

## 2017-12-24 NOTE — ED Notes (Addendum)
This RN confirmed with Dr. Cyril LoosenKinner that the pt can be safely transported via [facility]. No fluids hanging; pt has two IVs.   Glenda, ED secretary, contacting [facility] for transfer.

## 2017-12-25 ENCOUNTER — Telehealth: Payer: Self-pay | Admitting: Family Medicine

## 2017-12-25 NOTE — Telephone Encounter (Signed)
FYI. KW 

## 2017-12-25 NOTE — Telephone Encounter (Signed)
Dr. Mayford KnifeWilliams with [facility] stated pt is being discharged today after [facility]. Dr. Mayford KnifeWilliams scheduled a hospital f/u appt with Nadine CountsBob for Wednesday 12/30/17. Thanks TNP

## 2017-12-28 ENCOUNTER — Encounter: Payer: Self-pay | Admitting: Family Medicine

## 2017-12-28 ENCOUNTER — Ambulatory Visit: Payer: BC Managed Care – PPO | Admitting: Family Medicine

## 2017-12-28 VITALS — BP 144/88 | HR 84 | Temp 98.4°F | Resp 16 | Wt 148.0 lb

## 2017-12-28 DIAGNOSIS — S020XXD Fracture of vault of skull, subsequent encounter for fracture with routine healing: Secondary | ICD-10-CM

## 2017-12-28 NOTE — Progress Notes (Signed)
Subjective:     Patient ID: Jesse Lara, male   DOB: 01/18/1966, 52 y.o.   MRN: [id number] Chief Complaint  Patient presents with  . Hospitalization Follow-up    Skull fracture    HPI Here in f/u of 1/31-12/25/17 [facility] for a right parietal skull fracture after a syncopal episode felt to be vaso-vagal. He has continued on lisnnopril at 10 mg.for the last two nights.Denies dizziness or double vision. + headaches.Accompanied by his wife today.  Review of Systems     Objective:   Physical Exam  HENT:  No hemotympanum  Eyes: EOM are normal. Pupils are equal, round, and reactive to light.       Assessment:    1. Closed fracture of parietal bone of skull with routine healing, subsequent encounter:    Plan:    Work excuse for 2/4-01/01/18. No driving or climbing ladders. Continue bp medication at 10 mg. Pending f/u in one week.

## 2017-12-28 NOTE — Patient Instructions (Signed)
Continue lisinopril at 10 mg. No driving or climbing ladders.

## 2017-12-29 ENCOUNTER — Encounter: Payer: Self-pay | Admitting: Family Medicine

## 2017-12-30 ENCOUNTER — Inpatient Hospital Stay: Payer: BC Managed Care – PPO | Admitting: Family Medicine

## 2017-12-31 ENCOUNTER — Encounter: Payer: Self-pay | Admitting: Family Medicine

## 2018-01-04 ENCOUNTER — Encounter: Payer: Self-pay | Admitting: Family Medicine

## 2018-01-04 ENCOUNTER — Ambulatory Visit (INDEPENDENT_AMBULATORY_CARE_PROVIDER_SITE_OTHER): Payer: BC Managed Care – PPO | Admitting: Family Medicine

## 2018-01-04 VITALS — BP 134/78 | HR 84 | Temp 98.6°F | Resp 16 | Wt 150.0 lb

## 2018-01-04 DIAGNOSIS — S0219XS Other fracture of base of skull, sequela: Secondary | ICD-10-CM

## 2018-01-04 DIAGNOSIS — S0219XA Other fracture of base of skull, initial encounter for closed fracture: Secondary | ICD-10-CM | POA: Insufficient documentation

## 2018-01-04 NOTE — Progress Notes (Signed)
Subjective:     Patient ID: Jesse Lara, male   DOB: 08/29/1966, 52 y.o.   MRN: [id number] Chief Complaint  Patient presents with  . Fracture    Skull fracture   HPI States he has mild intermittent ringing in his left left ear. Reports transient dizziness if he gets up too fast. States he would like to return to work 2/18. 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  Review of Systems     Objective:   Physical Exam  HENT:  Ear canals patent, TM's intact, no hemotympanum.  Eyes: EOM are normal. Pupils are equal, round, and reactive to light.       Assessment:    1. Closed fracture of temporal bone, sequela (HCC)    Plan:    Return to work excuse on 01/11/18. Work form completed regarding ability to perform functions of his job.

## 2018-01-04 NOTE — Patient Instructions (Signed)
Let me know if ear ringing does not improve or other symptoms occur.

## 2018-01-07 ENCOUNTER — Encounter: Payer: Self-pay | Admitting: Family Medicine

## 2018-01-11 ENCOUNTER — Encounter: Payer: Self-pay | Admitting: Family Medicine

## 2018-01-13 ENCOUNTER — Ambulatory Visit (INDEPENDENT_AMBULATORY_CARE_PROVIDER_SITE_OTHER): Payer: BC Managed Care – PPO | Admitting: Family Medicine

## 2018-01-13 ENCOUNTER — Encounter: Payer: Self-pay | Admitting: Family Medicine

## 2018-01-13 VITALS — BP 190/98 | HR 98 | Resp 17

## 2018-01-13 DIAGNOSIS — I10 Essential (primary) hypertension: Secondary | ICD-10-CM | POA: Diagnosis not present

## 2018-01-13 DIAGNOSIS — S0219XD Other fracture of base of skull, subsequent encounter for fracture with routine healing: Secondary | ICD-10-CM | POA: Diagnosis not present

## 2018-01-13 DIAGNOSIS — S060X0A Concussion without loss of consciousness, initial encounter: Secondary | ICD-10-CM | POA: Insufficient documentation

## 2018-01-13 NOTE — Progress Notes (Signed)
Subjective:   Jesse Lara is a 52 y.o. male with a history of HTN, HLD, skull fracture here for elevated BP, dizziness, headaches  Was seen in the emergency room and [facility] for skull fracture on 12/24/17.  CT at that time revealed small comminuted parietal skull fracture with underlying small subdural hematoma.  He admits to tinnitus, dizziness, headaches since that time.  The headaches seem to be worsening.  He has noticed over the last week that his blood pressure is increasing more and more every day.  He was concerned when it was in the 170s-180s systolic over the last few days.  He walked into our clinic today asking to be seen.  Review of Systems:  Per HPI.   Social History   Socioeconomic History  . Marital status: Married    Spouse name: None  . Number of children: None  . Years of education: None  . Highest education level: None  Social Needs  . Financial resource strain: None  . Food insecurity - worry: None  . Food insecurity - inability: None  . Transportation needs - medical: None  . Transportation needs - non-medical: None  Occupational History  . None  Tobacco Use  . Smoking status: Never Smoker  . Smokeless tobacco: Never Used  Substance and Sexual Activity  . Alcohol use: No    Alcohol/week: 0.0 oz    Frequency: Never  . Drug use: No  . Sexual activity: None  Other Topics Concern  . None  Social History Narrative  . None    Objective:  BP (!) 190/98   Pulse 98   Resp 17   Gen:  52 y.o. male in NAD HEENT: NCAT, MMM, EOMI, PERRL, anicteric sclerae, OP clear CV: RRR, no MRG Resp: Non-labored, CTAB, no wheezes noted Ext: WWP, no edema MSK: Gait intact, strength intact Neuro: Alert and oriented, speech normal, cranial nerves intact, sensation intact to light touch, coordination intact       Chemistry      Component Value Date/Time   NA 137 12/24/2017 1229   NA 137 08/25/2016 0932   K 3.7 12/24/2017 1229   CL 103  12/24/2017 1229   CO2 26 12/24/2017 1229   BUN 14 12/24/2017 1229   BUN 8 08/25/2016 0932   CREATININE 0.89 12/24/2017 1229   CREATININE 0.83 08/10/2017 0935      Component Value Date/Time   CALCIUM 8.5 (L) 12/24/2017 1229   ALKPHOS 92 12/24/2017 1229   AST 46 (H) 12/24/2017 1229   ALT 45 12/24/2017 1229   BILITOT 1.0 12/24/2017 1229   BILITOT 1.0 08/25/2016 0932      Lab Results  Component Value Date   WBC 9.5 12/24/2017   HGB 15.3 12/24/2017   HCT 45.3 12/24/2017   MCV 88.3 12/24/2017   PLT 240 12/24/2017    Assessment & Plan:     Jesse MireCadle W Letendre is a 52 y.o. male here for accelerated HTN  1. Closed fracture of temporal bone with routine healing, subsequent encounter - with underlying subdural hemtaoma - possible that his symptoms are related to post-concussive syndrome, but could also represent enlarging subdural hematoma - neuro intact - advised visit to ER for possible imaging to rule out complications  2. Accelerated hypertension - BP elevated today with worsening over last week since skull fracture - this could be contributing to his symptoms, or they could contribute to HTN - advised ER visit for possible Head  CT as above - patient's wife to drive him to [facility] ER   Bacigalupo, Marzella Schlein, MD, MPH [facility] 01/13/2018 5:12 PM

## 2018-01-13 NOTE — Progress Notes (Signed)
Patient walked in office this afternoon for nurse blood pressure check, patient reports symptoms of lightheadiness and dizziness for over 48hrs, patient also reported increased blood pressure readings ( see email in chart).  Vitals:   01/13/18 1708  BP: (!) 190/98  Pulse: 98  Resp: 17  SpO2: 99%

## 2018-01-13 NOTE — Telephone Encounter (Signed)
Patient walked in for nurse blood pressure check and blood pressure was 190/98,  HR 98 patient complains of lightheadiness and dizziness spoke with Dr.B who states that patient should seek immediate medical attention. KW

## 2018-01-14 ENCOUNTER — Encounter: Payer: Self-pay | Admitting: Family Medicine

## 2018-01-14 ENCOUNTER — Other Ambulatory Visit: Payer: Self-pay | Admitting: Family Medicine

## 2018-01-14 MED ORDER — LISINOPRIL 20 MG PO TABS: 20.0000 mg | ORAL_TABLET | Freq: Every day | ORAL | 1 refills | Status: DC

## 2018-01-17 NOTE — Progress Notes (Signed)
Medication increased per Dr. Leonard SchwartzB.

## 2018-01-26 DIAGNOSIS — H8111 Benign paroxysmal vertigo, right ear: Secondary | ICD-10-CM | POA: Insufficient documentation

## 2018-01-27 ENCOUNTER — Ambulatory Visit (INDEPENDENT_AMBULATORY_CARE_PROVIDER_SITE_OTHER): Payer: BC Managed Care – PPO | Admitting: Physician Assistant

## 2018-01-27 ENCOUNTER — Encounter: Payer: Self-pay | Admitting: Physician Assistant

## 2018-01-27 VITALS — BP 142/80 | HR 87 | Temp 97.8°F | Resp 16 | Wt 148.0 lb

## 2018-01-27 DIAGNOSIS — S0219XD Other fracture of base of skull, subsequent encounter for fracture with routine healing: Secondary | ICD-10-CM

## 2018-01-27 DIAGNOSIS — S060X1D Concussion with loss of consciousness of 30 minutes or less, subsequent encounter: Secondary | ICD-10-CM

## 2018-01-27 DIAGNOSIS — I10 Essential (primary) hypertension: Secondary | ICD-10-CM

## 2018-01-27 NOTE — Progress Notes (Signed)
Patient: Jesse Lara Male    DOB: Jul 03, 1966   52 y.o.   MRN: [id number] Visit Date: 01/27/2018  Today's Provider: Margaretann Loveless, PA-C   Chief Complaint  Patient presents with  . Hypertension   Subjective:    HPI  Elevated Blood Pressure: Patient is here today with c/o elevated blood pressure. Reports that over January 31 he passed out and fracture his skull with concussion. Reports a couple weeks later he noticed his blood pressure being elevated. Reports that Dr.B increase Lisinopril to 20 mg.  BP Readings from Last 3 Encounters:  01/27/18 (!) 142/80  01/13/18 (!) 190/98  01/13/18 (!) 190/98   He is exercising.Just walking. He is adherent to low salt diet.   He is experiencing none.  Patient denies chest pain, chest pressure/discomfort, exertional chest pressure/discomfort, fatigue, irregular heart beat, lower extremity edema, near-syncope and palpitations.   Cardiovascular risk factors include dyslipidemia and male gender.  Use of agents associated with hypertension: none.     Weight trend: stable Wt Readings from Last 3 Encounters:  01/27/18 148 lb (67.1 kg)  01/04/18 150 lb (68 kg)  12/28/17 148 lb (67.1 kg)    Current diet: in general, a "healthy" diet    ------------------------------------------------------------------------ Patient reports he saw the neurologist yesterday. He is to go see Neuro back on Apr 02, 2018.    Allergies  Allergen Reactions  . Codeine Nausea Only     Current Outpatient Medications:  .  cetirizine (ZYRTEC) 10 MG tablet, Take 10 mg by mouth daily., Disp: , Rfl:  .  DOCOSAHEXAENOIC ACID PO, Take by mouth., Disp: , Rfl:  .  lisinopril (PRINIVIL,ZESTRIL) 20 MG tablet, Take 1 tablet (20 mg total) by mouth daily., Disp: 30 tablet, Rfl: 1 .  MULTIPLE VITAMIN PO, Take by mouth., Disp: , Rfl:  .  OMEGA-3 FATTY ACIDS PO, Take by mouth., Disp: , Rfl:   Review of Systems  Constitutional: Negative.   Respiratory: Negative.     Cardiovascular: Negative.   Gastrointestinal: Negative.   Musculoskeletal: Negative.   Neurological: Positive for dizziness and headaches.    Social History   Tobacco Use  . Smoking status: Never Smoker  . Smokeless tobacco: Never Used  Substance Use Topics  . Alcohol use: No    Alcohol/week: 0.0 oz    Frequency: Never   Objective:   BP (!) 142/80 (BP Location: Left Arm, Patient Position: Sitting, Cuff Size: Normal)   Pulse 87   Temp 97.8 F (36.6 C) (Oral)   Resp 16   Wt 148 lb (67.1 kg)   SpO2 99%   BMI 23.18 kg/m    Physical Exam  Constitutional: He is oriented to person, place, and time. He appears well-developed and well-nourished. No distress.  HENT:  Head: Normocephalic and atraumatic.  Neck: Normal range of motion. Neck supple. No JVD present. No tracheal deviation present. No thyromegaly present.  Cardiovascular: Normal rate, regular rhythm and normal heart sounds. Exam reveals no gallop and no friction rub.  No murmur heard. Pulmonary/Chest: Effort normal and breath sounds normal. No respiratory distress. He has no wheezes. He has no rales.  Musculoskeletal: He exhibits no edema.  Lymphadenopathy:    He has no cervical adenopathy.  Neurological: He is alert and oriented to person, place, and time.  Skin: He is not diaphoretic.  Vitals reviewed.      Assessment & Plan:     1. Essential hypertension Improving on lisinopril 20mg . Patient  reports missing the second dose on a few times. He has readings on his phone of his BP and the last week of February his readings were higher in the 140-150s/90s. Then on March 2nd his BP was normal at 118/84. No other readings following. Advised him to continue Lisinopril 20mg  (take both tabs at same time instead of BID to avoid forgetting second dose). May call with BP readings if still elevated and f/u with Dr. Beryle FlockBacigalupo in 4-6 weeks.   2. Closed fracture of temporal bone with routine healing, subsequent  encounter Improving. Continue slowly increasing physical activity as tolerated.   3. Concussion with loss of consciousness of 30 minutes or less, subsequent encounter See above medical treatment plan.       Margaretann LovelessJennifer M Burnette, PA-C  [facility]

## 2018-02-02 ENCOUNTER — Encounter: Payer: Self-pay | Admitting: Family Medicine

## 2018-02-03 ENCOUNTER — Other Ambulatory Visit: Payer: Self-pay | Admitting: Family Medicine

## 2018-02-03 MED ORDER — LISINOPRIL 40 MG PO TABS: 40.0000 mg | ORAL_TABLET | Freq: Every day | ORAL | 5 refills | Status: DC

## 2018-02-06 ENCOUNTER — Ambulatory Visit: Payer: BC Managed Care – PPO | Admitting: Physician Assistant

## 2018-03-23 ENCOUNTER — Ambulatory Visit (INDEPENDENT_AMBULATORY_CARE_PROVIDER_SITE_OTHER): Payer: BC Managed Care – PPO | Admitting: Family Medicine

## 2018-03-23 ENCOUNTER — Encounter: Payer: Self-pay | Admitting: Family Medicine

## 2018-03-23 VITALS — BP 160/88 | HR 77 | Temp 98.1°F | Resp 16 | Wt 151.0 lb

## 2018-03-23 DIAGNOSIS — I10 Essential (primary) hypertension: Secondary | ICD-10-CM

## 2018-03-23 MED ORDER — LISINOPRIL 40 MG PO TABS: 40.0000 mg | ORAL_TABLET | Freq: Every day | ORAL | 3 refills | Status: DC

## 2018-03-23 MED ORDER — HYDROCHLOROTHIAZIDE 12.5 MG PO TABS: 12.5000 mg | ORAL_TABLET | Freq: Every day | ORAL | 3 refills | Status: DC

## 2018-03-23 NOTE — Assessment & Plan Note (Signed)
Uncontrolled Was previously taking lisinopril-hctz combo several years ago until he started running.  His BP has been steadily increasing since that time Discussed goal of BP <140/90 Will continue lisinopril  daily Add HCTZ 12.5mg  daily Discussed need for adequate hydration if running while taking diuretic Reviewed recent BMP - wnl F/u in 6 wks and consider dose titration

## 2018-03-23 NOTE — Progress Notes (Signed)
Patient: Jesse Lara Male    DOB: 07/08/1966   52 y.o.   MRN: [id number] Visit Date: 03/23/2018  Today's Provider: Shirlee Latch, MD   I, Joslyn Hy, CMA, am acting as scribe for Shirlee Latch, MD.  Chief Complaint  Patient presents with  . Hypertension   Subjective:    HPI      Hypertension, follow-up:  BP Readings from Last 3 Encounters:  03/23/18 (!) 160/88  01/27/18 (!) 142/80  01/13/18 (!) 190/98    He was last seen for hypertension 6 weeks ago.  BP at that visit was 142/80. Management since that visit includes continuing lisinopril 40 mg po qd. He reports good compliance with treatment. He takes this in the evenings. He is not having side effects.  He is exercising. Has started running. He is adherent to low salt diet.   Outside blood pressures are not being checked. He is experiencing none.  Patient denies chest pain, chest pressure/discomfort, claudication, dyspnea, exertional chest pressure/discomfort, fatigue, irregular heart beat, lower extremity edema, near-syncope, orthopnea, palpitations and syncope.   Cardiovascular risk factors include hypertension and male gender.  Use of agents associated with hypertension: none.     Weight trend: stable Wt Readings from Last 3 Encounters:  03/23/18 151 lb (68.5 kg)  01/27/18 148 lb (67.1 kg)  01/04/18 150 lb (68 kg)    Current diet: healthier in the last year than it has ever been  ------------------------------------------------------------------------   Allergies  Allergen Reactions  . Codeine Nausea Only     Current Outpatient Medications:  .  cetirizine (ZYRTEC) 10 MG tablet, Take 10 mg by mouth daily., Disp: , Rfl:  .  lisinopril (PRINIVIL,ZESTRIL) 40 MG tablet, Take 1 tablet (40 mg total) by mouth daily., Disp: 30 tablet, Rfl: 5 .  MULTIPLE VITAMIN PO, Take by mouth., Disp: , Rfl:  .  OMEGA-3 FATTY ACIDS PO, Take by mouth., Disp: , Rfl:   Review of Systems  Constitutional:  Negative for activity change, appetite change, chills, diaphoresis, fatigue, fever and unexpected weight change.  Respiratory: Negative.  Negative for shortness of breath.   Cardiovascular: Negative for chest pain, palpitations and leg swelling.  Gastrointestinal: Negative.   Genitourinary: Negative.   Musculoskeletal: Negative.   Skin: Negative.   Neurological: Negative.   Psychiatric/Behavioral: Negative.     Social History   Tobacco Use  . Smoking status: Never Smoker  . Smokeless tobacco: Never Used  Substance Use Topics  . Alcohol use: No    Alcohol/week: 0.0 oz    Frequency: Never   Objective:   BP (!) 160/88 (BP Location: Left Arm, Patient Position: Sitting, Cuff Size: Normal)   Pulse 77   Temp 98.1 F (36.7 C) (Oral)   Resp 16   Wt 151 lb (68.5 kg)   SpO2 98%   BMI 23.65 kg/m  Vitals:   03/23/18 1055  BP: (!) 160/88  Pulse: 77  Resp: 16  Temp: 98.1 F (36.7 C)  TempSrc: Oral  SpO2: 98%  Weight: 151 lb (68.5 kg)     Physical Exam  Constitutional: He is oriented to person, place, and time. He appears well-developed and well-nourished. No distress.  HENT:  Head: Normocephalic and atraumatic.  Eyes: Conjunctivae are normal. No scleral icterus.  Neck: Neck supple. No thyromegaly present.  Cardiovascular: Normal rate, regular rhythm, normal heart sounds and intact distal pulses.  No murmur heard. Pulmonary/Chest: Effort normal and breath sounds normal. He has no wheezes. He has  no rales.  Musculoskeletal: He exhibits no edema or deformity.  Lymphadenopathy:    He has no cervical adenopathy.  Neurological: He is alert and oriented to person, place, and time.  Skin: Skin is warm and dry. Capillary refill takes less than 2 seconds. No rash noted.  Psychiatric: He has a normal mood and affect. His behavior is normal.  Vitals reviewed.      Assessment & Plan:   Problem List Items Addressed This Visit      Cardiovascular and Mediastinum   BP (high blood  pressure) - Primary    Uncontrolled Was previously taking lisinopril-hctz combo several years ago until he started running.  His BP has been steadily increasing since that time Discussed goal of BP <140/90 Will continue lisinopril  daily Add HCTZ 12.5mg  daily Discussed need for adequate hydration if running while taking diuretic Reviewed recent BMP - wnl F/u in 6 wks and consider dose titration      Relevant Medications   hydrochlorothiazide (HYDRODIURIL) 12.5 MG tablet   lisinopril (PRINIVIL,ZESTRIL) 40 MG tablet       Return in about 6 weeks (around 05/04/2018) for BP f/u.   The entirety of the information documented in the History of Present Illness, Review of Systems and Physical Exam were personally obtained by me. Portions of this information were initially documented by Irving Burton Ratchford, CMA and reviewed by me for thoroughness and accuracy.    Erasmo Downer, MD, MPH [facility] 03/23/2018 12:02 PM

## 2018-03-30 IMAGING — CT CT HEAD W/O CM
3 series · 14 of 47 positions shown, 16 images · non-contrast
Comparison: None.

CLINICAL DATA: 51-year-old male status post fall at restaurant.
Struck right side of head.

EXAM:
CT HEAD WITHOUT CONTRAST
TECHNIQUE: Contiguous axial images were obtained from the base of the skull
through the vertex without intravenous contrast.

[Series 3: head wo · axial · 0.43mm/px · z∈[-94,+31]mm · 8 of 30 slices shown, 10 images]
[im 3/30  brain]
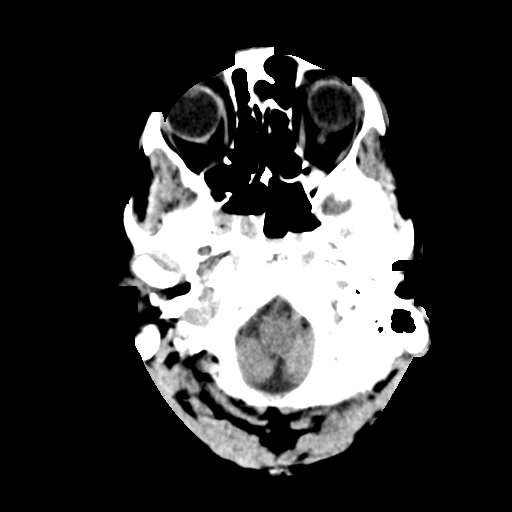
[im 3/30  bone]
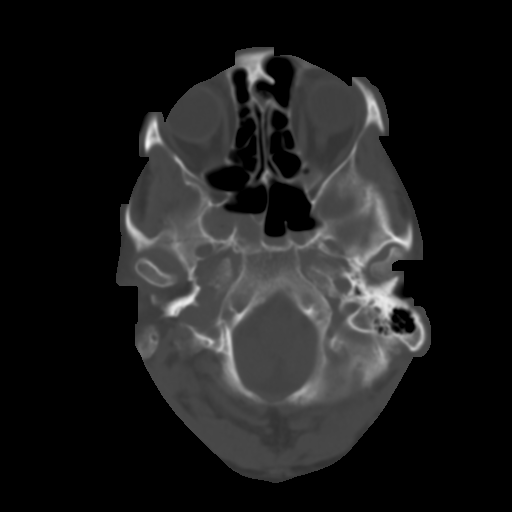
[im 7/30  brain]
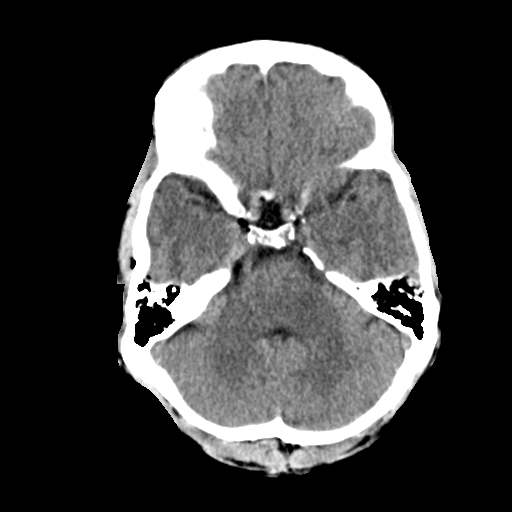
[im 10/30  brain]
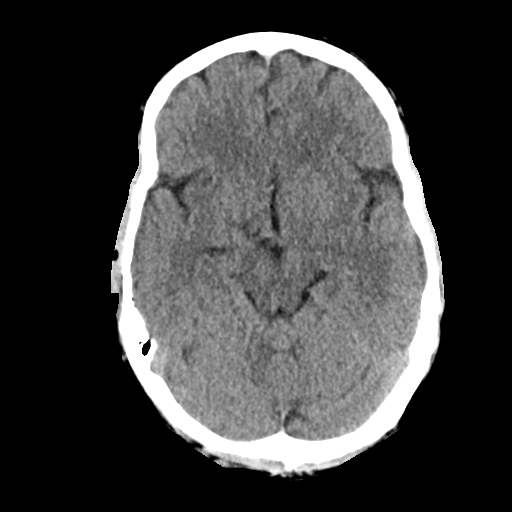
[im 14/30  brain]
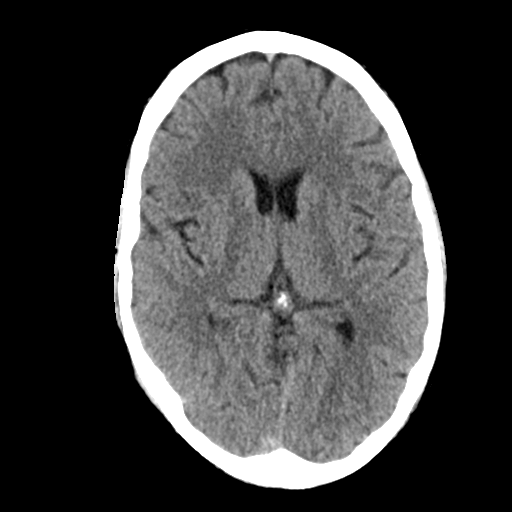
[im 17/30  brain]
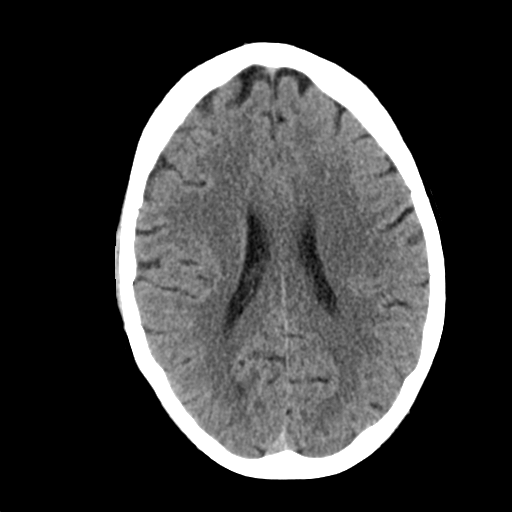
[im 17/30  bone]
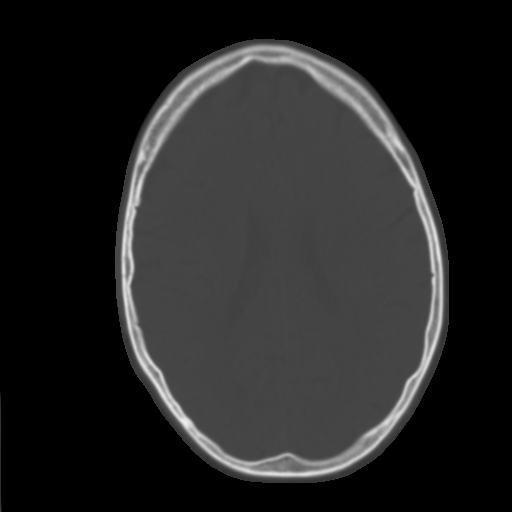
[im 21/30  brain]
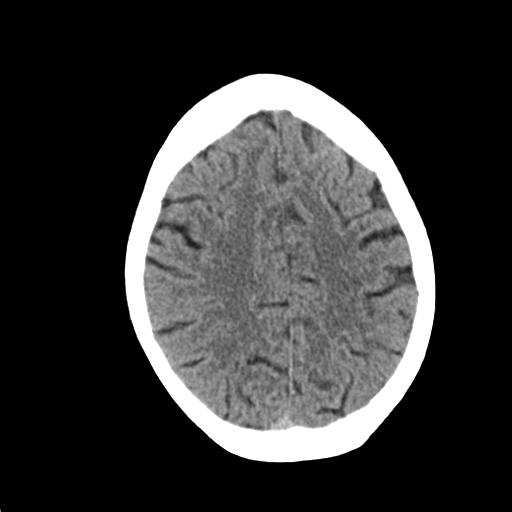
[im 24/30  brain]
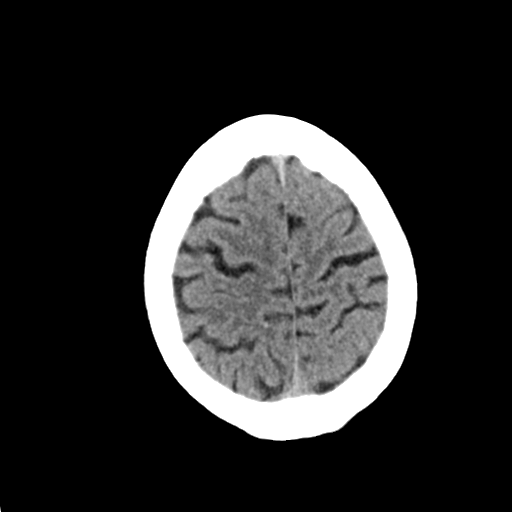
[im 28/30  brain]
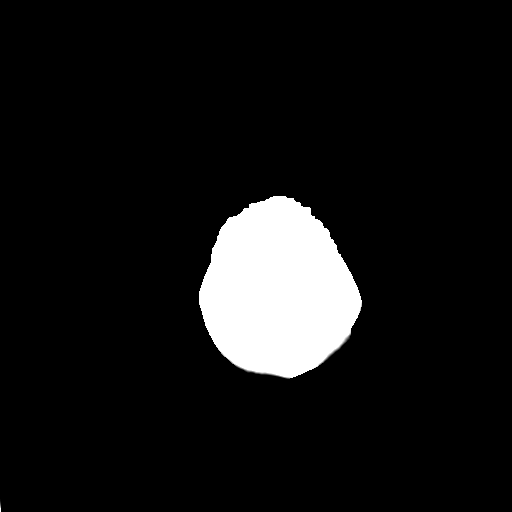

[Series 4: coronal soft tissue · coronal · 0.29mm/px · 3 of 69 slices shown]
[im 23/69  brain]
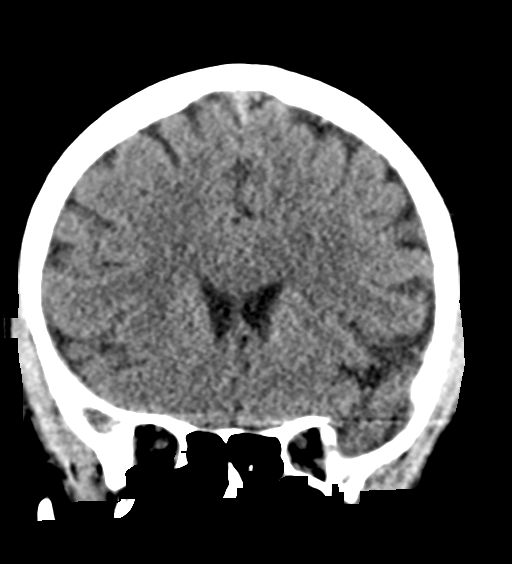
[im 31/69  brain]
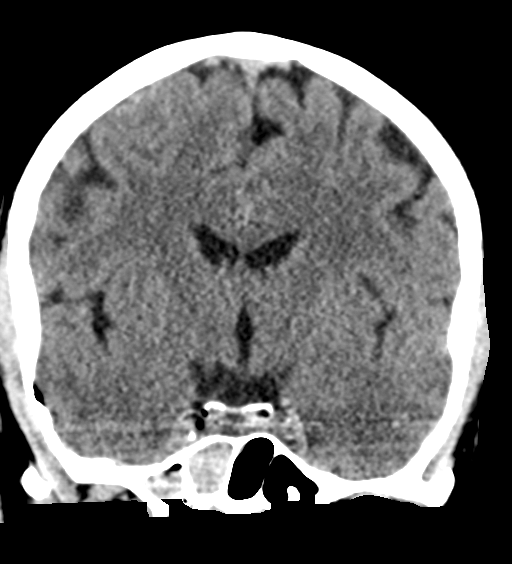
[im 38/69  brain]
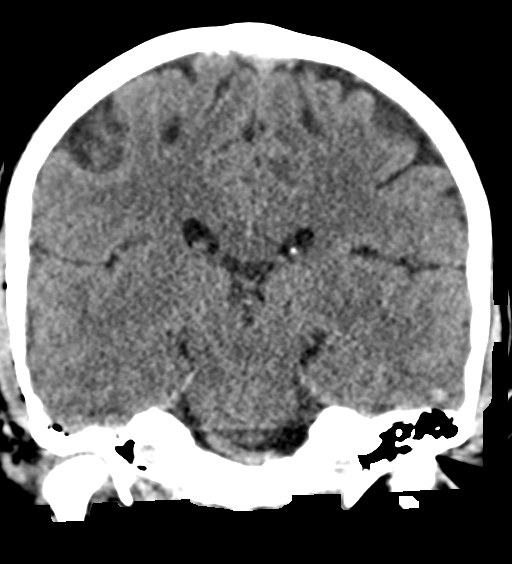

[Series 5: sagittal soft tissue · sagittal · 0.30mm/px · 3 of 56 slices shown]
[im 19/56  brain]
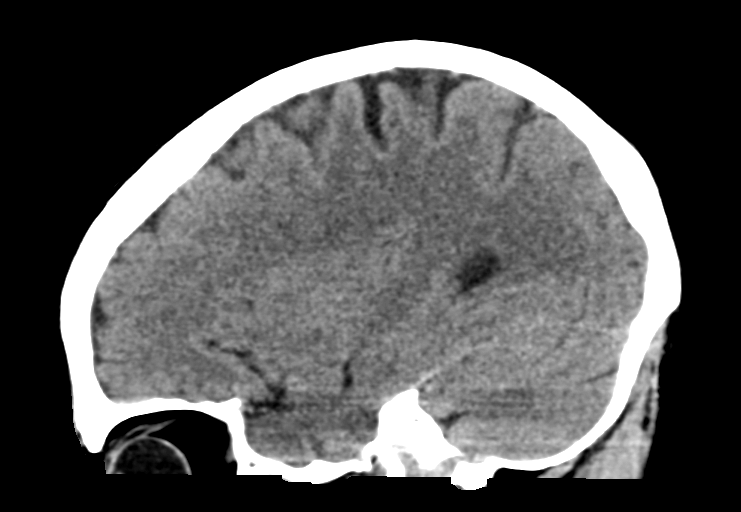
[im 28/56  brain]
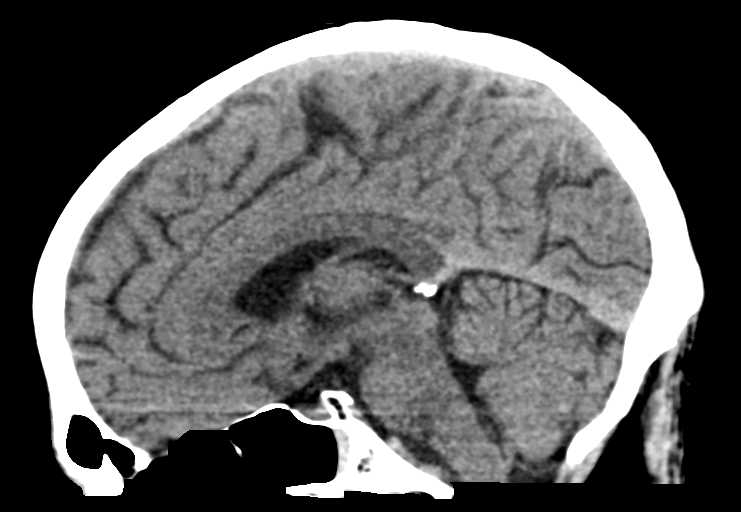
[im 37/56  brain]
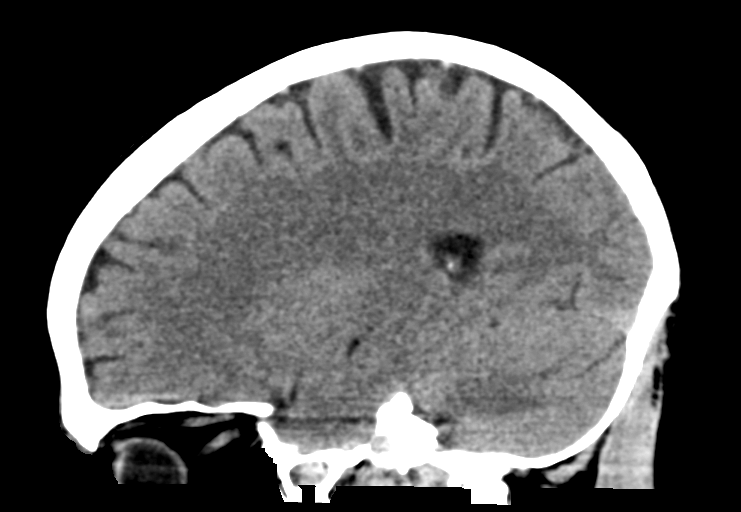

[14 of 47 positions shown; findings below may reference images not displayed]

FINDINGS: Brain: Along the trace or small volume pneumocephalus right lateral
temporal lobe. Questionable trace associated subdural hematoma
(coronal image 35). No midline shift. No hemorrhagic contusion
identified. No intraventricular hemorrhage.

No ventriculomegaly, mass effect, evidence of mass lesion, or
evidence of cortically based acute infarction. Gray-white matter
differentiation is within normal limits throughout the brain.

Vascular: No suspicious intracranial vascular hyperdensity.

Skull: Linear but mildly comminuted skull fracture with an oblique
vertical orientation along the right lateral skull convexity (series
5, image 6 and series 2, image 19 courses through the right sphenoid
wing and to the right middle cranial fossa. The fracture line is
anterior to the right foramen ovale and continues into the right
sphenoid sinus wall. Small volume associated pneumocephalus. Small
volume associated gas in the right cavernous sinus and also the
scalp soft tissues.

No other skull fracture identified.

Sinuses/Orbits: Moderate hemorrhage level in the right sphenoid
sinus. Small hemorrhage levels in the left sphenoid and visible
maxillary sinuses. Mild ethmoid mucosal thickening. Trace bubbly
opacity in the left frontal sinus.

Both tympanic cavities and the bilateral mastoids are clear.

Other: Broad-based right lateral scalp hematoma with small volume
subcutaneous gas. Other scalp and orbits soft tissues appear normal.
IMPRESSION: 1. Linear, mildly comminuted skull fracture tracking from the right
lateral parietal bone through the right sphenoid wing and sphenoid
bone into the right sphenoid sinus.
2. Questionable trace Right Subdural Hematoma along the lateral
right temporal lobe, at the site of trace pneumocephalus. A
noncontrast Brain MRI would confirm or refute this finding if
necessary for treatment planning.
3. No other intracranial hemorrhage or acute traumatic injury to the
brain identified.
4. Right lateral scalp hematoma. Small volume of hemorrhage layering
in the paranasal sinuses.

## 2018-04-03 ENCOUNTER — Encounter: Payer: Self-pay | Admitting: Family Medicine

## 2018-04-03 DIAGNOSIS — I10 Essential (primary) hypertension: Secondary | ICD-10-CM

## 2018-04-20 DIAGNOSIS — R55 Syncope and collapse: Secondary | ICD-10-CM | POA: Insufficient documentation

## 2018-04-29 ENCOUNTER — Encounter: Payer: Self-pay | Admitting: Family Medicine

## 2018-04-29 DIAGNOSIS — I10 Essential (primary) hypertension: Secondary | ICD-10-CM

## 2018-04-29 DIAGNOSIS — Z125 Encounter for screening for malignant neoplasm of prostate: Secondary | ICD-10-CM

## 2018-05-01 ENCOUNTER — Telehealth: Payer: Self-pay

## 2018-05-01 DIAGNOSIS — E871 Hypo-osmolality and hyponatremia: Secondary | ICD-10-CM

## 2018-05-01 LAB — BASIC METABOLIC PANEL
BUN/Creatinine Ratio: 16 (ref 9–20)
BUN: 12 mg/dL (ref 6–24)
CO2: 23 mmol/L (ref 20–29)
CREATININE: 0.76 mg/dL (ref 0.76–1.27)
Calcium: 9.1 mg/dL (ref 8.7–10.2)
Chloride: 85 mmol/L — ABNORMAL LOW (ref 96–106)
GFR calc Af Amer: 123 mL/min/{1.73_m2} (ref >59)
GFR, EST NON AFRICAN AMERICAN: 106 mL/min/{1.73_m2} (ref >59)
Glucose: 93 mg/dL (ref 65–99)
Potassium: 4.2 mmol/L (ref 3.5–5.2)
Sodium: 122 mmol/L — ABNORMAL LOW (ref 134–144)

## 2018-05-01 LAB — PSA TOTAL (REFLEX TO FREE): Prostate Specific Ag, Serum: 0.4 ng/mL (ref 0.0–4.0)

## 2018-05-01 NOTE — Telephone Encounter (Signed)
BNP ordered per verbal order from Dr. Leonard SchwartzB.

## 2018-05-03 ENCOUNTER — Telehealth: Payer: Self-pay

## 2018-05-03 ENCOUNTER — Other Ambulatory Visit: Payer: Self-pay | Admitting: Family Medicine

## 2018-05-03 DIAGNOSIS — E871 Hypo-osmolality and hyponatremia: Secondary | ICD-10-CM

## 2018-05-03 NOTE — Telephone Encounter (Signed)
Misheard previous order for BMP. Reordered.

## 2018-05-04 ENCOUNTER — Telehealth: Payer: Self-pay

## 2018-05-04 LAB — BASIC METABOLIC PANEL
BUN/Creatinine Ratio: 11 (ref 9–20)
BUN: 9 mg/dL (ref 6–24)
CO2: 22 mmol/L (ref 20–29)
Calcium: 8.8 mg/dL (ref 8.7–10.2)
Chloride: 88 mmol/L — ABNORMAL LOW (ref 96–106)
Creatinine, Ser: 0.8 mg/dL (ref 0.76–1.27)
GFR calc Af Amer: 120 mL/min/{1.73_m2} (ref >59)
GFR, EST NON AFRICAN AMERICAN: 104 mL/min/{1.73_m2} (ref >59)
Glucose: 88 mg/dL (ref 65–99)
Potassium: 4.5 mmol/L (ref 3.5–5.2)
SODIUM: 125 mmol/L — AB (ref 134–144)

## 2018-05-04 NOTE — Telephone Encounter (Signed)
Viewed by Juluis Mireadle W Ohlrich on 05/04/2018 9:26 AM

## 2018-05-04 NOTE — Telephone Encounter (Signed)
-----   Message from Erasmo DownerAngela M Bacigalupo, MD sent at 05/04/2018  8:54 AM EDT ----- Sodium is improving.  This is good.  Continue holding HCTZ.  We will discuss BP control at appt tomorrow.  [name], Angela M, MD, MPH [facility] 05/04/2018 8:53 AM

## 2018-05-05 ENCOUNTER — Encounter: Payer: Self-pay | Admitting: Family Medicine

## 2018-05-05 ENCOUNTER — Ambulatory Visit (INDEPENDENT_AMBULATORY_CARE_PROVIDER_SITE_OTHER): Payer: BC Managed Care – PPO | Admitting: Family Medicine

## 2018-05-05 VITALS — BP 154/90 | HR 74 | Temp 98.2°F | Resp 16 | Wt 146.0 lb

## 2018-05-05 DIAGNOSIS — E871 Hypo-osmolality and hyponatremia: Secondary | ICD-10-CM | POA: Diagnosis not present

## 2018-05-05 DIAGNOSIS — Z1211 Encounter for screening for malignant neoplasm of colon: Secondary | ICD-10-CM

## 2018-05-05 DIAGNOSIS — I10 Essential (primary) hypertension: Secondary | ICD-10-CM | POA: Diagnosis not present

## 2018-05-05 MED ORDER — AMLODIPINE BESYLATE 5 MG PO TABS: 5.0000 mg | ORAL_TABLET | Freq: Every day | ORAL | 3 refills | Status: DC

## 2018-05-05 NOTE — Progress Notes (Signed)
Patient: Jesse Lara Male    DOB: 12/30/1965   52 y.o.   MRN: [id number] Visit Date: 05/05/2018  Today's Provider: Shirlee LatchAngela Bacigalupo, MD   I, Joslyn HyEmily Ratchford, CMA, am acting as scribe for Shirlee LatchAngela Bacigalupo, MD.  Chief Complaint  Patient presents with  . Hypertension   Subjective:    HPI      Hypertension, follow-up:  BP Readings from Last 3 Encounters:  05/05/18 (!) 154/90  03/23/18 (!) 160/88  01/27/18 (!) 142/80    Pt saw cardiology on 04/20/2018.Marland Kitchen.  He was advised to continue lisinopril 40 mg and HCTZ 12.5 mg po qd. He was also advised to keep log of BP. Pt had to D/C his HCTZ due to hyponatremia. His cardiologist advised him to consider to adding amlodipine if BP is >130/90.  He reports good compliance with treatment. He is not having side effects.  He is exercising. Running. He is adherent to low salt diet.   Outside blood pressures are not being checked since D/C HCTZ. He is experiencing none.  Patient denies chest pain, claudication, dyspnea, exertional chest pressure/discomfort, fatigue, irregular heart beat, lower extremity edema, near-syncope, orthopnea, palpitations and syncope.   Cardiovascular risk factors include hypertension and male gender.  Use of agents associated with hypertension: none.     Weight trend: stable Wt Readings from Last 3 Encounters:  05/05/18 146 lb (66.2 kg)  03/23/18 151 lb (68.5 kg)  01/27/18 148 lb (67.1 kg)    Current diet: in general, a "healthy" diet    ------------------------------------------------------------------------   Allergies  Allergen Reactions  . Codeine Nausea Only     Current Outpatient Medications:  .  cetirizine (ZYRTEC) 10 MG tablet, Take 10 mg by mouth daily., Disp: , Rfl:  .  lisinopril (PRINIVIL,ZESTRIL) 40 MG tablet, Take 1 tablet (40 mg total) by mouth daily., Disp: 90 tablet, Rfl: 3 .  MULTIPLE VITAMIN PO, Take by mouth., Disp: , Rfl:  .  OMEGA-3 FATTY ACIDS PO, Take by mouth., Disp:  , Rfl:   Review of Systems  Constitutional: Negative for activity change, appetite change, chills, diaphoresis, fatigue, fever and unexpected weight change.  HENT: Negative.   Eyes: Negative.   Respiratory: Positive for chest tightness ("once in a blue moon"). Negative for cough, choking, shortness of breath, wheezing and stridor.   Cardiovascular: Negative for chest pain, palpitations and leg swelling.  Gastrointestinal: Negative.   Genitourinary: Negative.   Musculoskeletal: Negative.   Neurological: Negative for dizziness, syncope and headaches.  Psychiatric/Behavioral: Negative.     Social History   Tobacco Use  . Smoking status: Never Smoker  . Smokeless tobacco: Never Used  Substance Use Topics  . Alcohol use: No    Alcohol/week: 0.0 oz    Frequency: Never   Objective:   BP (!) 154/90 (BP Location: Left Arm, Patient Position: Sitting, Cuff Size: Normal)   Pulse 74   Temp 98.2 F (36.8 C) (Oral)   Resp 16   Wt 146 lb (66.2 kg)   SpO2 99%   BMI 22.87 kg/m  Vitals:   05/05/18 0807  BP: (!) 154/90  Pulse: 74  Resp: 16  Temp: 98.2 F (36.8 C)  TempSrc: Oral  SpO2: 99%  Weight: 146 lb (66.2 kg)     Physical Exam  Constitutional: He is oriented to person, place, and time. He appears well-developed and well-nourished. No distress.  HENT:  Head: Normocephalic and atraumatic.  Eyes: Conjunctivae are normal. No scleral icterus.  Neck:  Neck supple. No thyromegaly present.  Cardiovascular: Normal rate, regular rhythm, normal heart sounds and intact distal pulses.  No murmur heard. Pulmonary/Chest: Effort normal and breath sounds normal. No respiratory distress. He has no wheezes. He has no rales.  Musculoskeletal: He exhibits no edema.  Lymphadenopathy:    He has no cervical adenopathy.  Neurological: He is alert and oriented to person, place, and time.  Skin: Skin is warm and dry. Capillary refill takes less than 2 seconds. No rash noted.  Psychiatric: He has a  normal mood and affect. His behavior is normal.       Assessment & Plan:     Problem List Items Addressed This Visit      Cardiovascular and Mediastinum   BP (high blood pressure) - Primary    Uncontrolled Was previously controlled on home readings prior to d/c HCTZ Needs to not restart HCTZ - added to allergy list Start amlodipine 5mg  daily Continue lisinopril Discussed goal BP <140/90 and ideally <130/90 It is possible that BP will downtrend after he fully recovers from head injury per cardiology F/u in 1 month      Relevant Medications   amLODipine (NORVASC) 5 MG tablet     Other   Hyponatremia    Seems likely subacute Asymptomatic Improving Likely 2/2 thiazide which has been d/c'd Recheck next week and continue to trend until normalizes Discussed symptoms that warrant f/u or emergent eval      Relevant Orders   Basic Metabolic Panel (BMET)    Other Visit Diagnoses    Colon cancer screening       Relevant Orders   Ambulatory referral to Gastroenterology       Return in about 1 month (around 06/02/2018) for BP f/u.   The entirety of the information documented in the History of Present Illness, Review of Systems and Physical Exam were personally obtained by me. Portions of this information were initially documented by Irving Burton Ratchford, CMA and reviewed by me for thoroughness and accuracy.    Erasmo Downer, MD, MPH [facility] 05/05/2018 8:51 AM

## 2018-05-05 NOTE — Assessment & Plan Note (Signed)
Uncontrolled Was previously controlled on home readings prior to d/c HCTZ Needs to not restart HCTZ - added to allergy list Start amlodipine 5mg  daily Continue lisinopril Discussed goal BP <140/90 and ideally <130/90 It is possible that BP will downtrend after he fully recovers from head injury per cardiology F/u in 1 month

## 2018-05-05 NOTE — Patient Instructions (Signed)
Hyponatremia °Hyponatremia is when the amount of salt (sodium) in your blood is too low. When sodium levels are low, your cells absorb extra water and they swell. The swelling happens throughout the body, but it mostly affects the brain. °What are the causes? °This condition may be caused by: °· Heart, kidney, or liver problems. °· Thyroid problems. °· Adrenal gland problems. °· Metabolic conditions, such as syndrome of inappropriate antidiuretic hormone (SIADH). °· Severe vomiting and diarrhea. °· Certain medicines or illegal drugs. °· Dehydration. °· Drinking too much water. °· Eating a diet that is low in sodium. °· Large burns on your body. °· Sweating. ° °What increases the risk? °This condition is more likely to develop in people who: °· Have long-term (chronic) kidney disease. °· Have heart failure. °· Have a medical condition that causes frequent or excessive diarrhea. °· Have metabolic conditions, such as Addison disease or SIADH. °· Take certain medicines that affect the sodium and fluid balance in the blood. Some of these medicine types include: °? Diuretics. °? NSAIDs. °? Some opioid pain medicines. °? Some antidepressants. °? Some seizure prevention medicines. ° °What are the signs or symptoms? °Symptoms of this condition include: °· Nausea and vomiting. °· Confusion. °· Lethargy. °· Agitation. °· Headache. °· Seizures. °· Unconsciousness. °· Appetite loss. °· Muscle weakness and cramping. °· Feeling weak or light-headed. °· Having a rapid heart rate. °· Fainting, in severe cases. ° °How is this diagnosed? °This condition is diagnosed with a medical history and physical exam. You will also have other tests, including: °· Blood tests. °· Urine tests. ° °How is this treated? °Treatment for this condition depends on the cause. Treatment may include: °· Fluids given through an IV tube that is inserted into one of your veins. °· Medicines to correct the sodium imbalance. If medicines are causing the  condition, the medicines will need to be adjusted. °· Limiting water or fluid intake to get the correct sodium balance. ° °Follow these instructions at home: °· Take medicines only as directed by your health care provider. Many medicines can make this condition worse. Talk with your health care provider about any medicines that you are currently taking. °· Carefully follow a recommended diet as directed by your health care provider. °· Carefully follow instructions from your health care provider about fluid restrictions. °· Keep all follow-up visits as directed by your health care provider. This is important. °· Do not drink alcohol. °Contact a health care provider if: °· You develop worsening nausea, fatigue, headache, confusion, or weakness. °· Your symptoms go away and then return. °· You have problems following the recommended diet. °Get help right away if: °· You have a seizure. °· You faint. °· You have ongoing diarrhea or vomiting. °This information is not intended to replace advice given to you by your health care provider. Make sure you discuss any questions you have with your health care provider. °Document Released: 10/31/2002 Document Revised: 04/17/2016 Document Reviewed: 11/30/2014 °Elsevier Interactive Patient Education © 2018 [employer] ° °

## 2018-05-05 NOTE — Assessment & Plan Note (Signed)
Seems likely subacute Asymptomatic Improving Likely 2/2 thiazide which has been d/c'd Recheck next week and continue to trend until normalizes Discussed symptoms that warrant f/u or emergent eval

## 2018-05-11 ENCOUNTER — Other Ambulatory Visit: Payer: Self-pay

## 2018-05-11 ENCOUNTER — Encounter: Payer: Self-pay | Admitting: Family Medicine

## 2018-05-11 DIAGNOSIS — Z1211 Encounter for screening for malignant neoplasm of colon: Secondary | ICD-10-CM

## 2018-05-11 LAB — BASIC METABOLIC PANEL
BUN/Creatinine Ratio: 10 (ref 9–20)
BUN: 9 mg/dL (ref 6–24)
CO2: 21 mmol/L (ref 20–29)
CREATININE: 0.88 mg/dL (ref 0.76–1.27)
Calcium: 9.4 mg/dL (ref 8.7–10.2)
Chloride: 95 mmol/L — ABNORMAL LOW (ref 96–106)
GFR calc Af Amer: 116 mL/min/{1.73_m2} (ref >59)
GFR, EST NON AFRICAN AMERICAN: 100 mL/min/{1.73_m2} (ref >59)
Glucose: 117 mg/dL — ABNORMAL HIGH (ref 65–99)
Potassium: 3.9 mmol/L (ref 3.5–5.2)
Sodium: 132 mmol/L — ABNORMAL LOW (ref 134–144)

## 2018-05-14 ENCOUNTER — Other Ambulatory Visit: Payer: Self-pay

## 2018-05-14 ENCOUNTER — Telehealth: Payer: Self-pay

## 2018-05-14 DIAGNOSIS — E871 Hypo-osmolality and hyponatremia: Secondary | ICD-10-CM

## 2018-05-14 NOTE — Telephone Encounter (Signed)
-----   Message from Erasmo DownerAngela M Bacigalupo, MD sent at 05/11/2018  8:11 AM EDT ----- Sodium continues to improve.  Now up to 132.  Recheck BMP again in 1 week Irving Burton(Emily can you print lab slip and leave at front desk?).  [name], Angela M, MD, MPH [facility] 05/11/2018 8:11 AM

## 2018-05-14 NOTE — Telephone Encounter (Signed)
Left message advising pt. OK per DPR. Lab ordered and requisition at front desk.

## 2018-05-24 ENCOUNTER — Telehealth: Payer: Self-pay

## 2018-05-24 NOTE — Telephone Encounter (Signed)
Pt called saying he has not rec the cologuard test yet.  Also is he supposed to get another lab test?  Pt's cb [phone]  Thanks Barth Kirksteri

## 2018-05-24 NOTE — Telephone Encounter (Signed)
Patient contacted office to cancel his colonoscopy.  Notified Endo to cancel.

## 2018-05-31 ENCOUNTER — Ambulatory Visit: Admit: 2018-05-31 | Payer: BC Managed Care – PPO | Admitting: Gastroenterology

## 2018-05-31 SURGERY — COLONOSCOPY WITH PROPOFOL
Anesthesia: General

## 2018-06-04 ENCOUNTER — Ambulatory Visit (INDEPENDENT_AMBULATORY_CARE_PROVIDER_SITE_OTHER): Payer: BC Managed Care – PPO | Admitting: Family Medicine

## 2018-06-04 VITALS — BP 144/92 | HR 64 | Temp 98.3°F | Resp 16 | Wt 154.0 lb

## 2018-06-04 DIAGNOSIS — E871 Hypo-osmolality and hyponatremia: Secondary | ICD-10-CM

## 2018-06-04 DIAGNOSIS — I10 Essential (primary) hypertension: Secondary | ICD-10-CM

## 2018-06-04 MED ORDER — AMLODIPINE BESYLATE 10 MG PO TABS: 10.0000 mg | ORAL_TABLET | Freq: Every day | ORAL | 2 refills | Status: DC

## 2018-06-04 NOTE — Patient Instructions (Signed)
Hyponatremia °Hyponatremia is when the amount of salt (sodium) in your blood is too low. When sodium levels are low, your cells absorb extra water and they swell. The swelling happens throughout the body, but it mostly affects the brain. °What are the causes? °This condition may be caused by: °· Heart, kidney, or liver problems. °· Thyroid problems. °· Adrenal gland problems. °· Metabolic conditions, such as syndrome of inappropriate antidiuretic hormone (SIADH). °· Severe vomiting and diarrhea. °· Certain medicines or illegal drugs. °· Dehydration. °· Drinking too much water. °· Eating a diet that is low in sodium. °· Large burns on your body. °· Sweating. ° °What increases the risk? °This condition is more likely to develop in people who: °· Have long-term (chronic) kidney disease. °· Have heart failure. °· Have a medical condition that causes frequent or excessive diarrhea. °· Have metabolic conditions, such as Addison disease or SIADH. °· Take certain medicines that affect the sodium and fluid balance in the blood. Some of these medicine types include: °? Diuretics. °? NSAIDs. °? Some opioid pain medicines. °? Some antidepressants. °? Some seizure prevention medicines. ° °What are the signs or symptoms? °Symptoms of this condition include: °· Nausea and vomiting. °· Confusion. °· Lethargy. °· Agitation. °· Headache. °· Seizures. °· Unconsciousness. °· Appetite loss. °· Muscle weakness and cramping. °· Feeling weak or light-headed. °· Having a rapid heart rate. °· Fainting, in severe cases. ° °How is this diagnosed? °This condition is diagnosed with a medical history and physical exam. You will also have other tests, including: °· Blood tests. °· Urine tests. ° °How is this treated? °Treatment for this condition depends on the cause. Treatment may include: °· Fluids given through an IV tube that is inserted into one of your veins. °· Medicines to correct the sodium imbalance. If medicines are causing the  condition, the medicines will need to be adjusted. °· Limiting water or fluid intake to get the correct sodium balance. ° °Follow these instructions at home: °· Take medicines only as directed by your health care provider. Many medicines can make this condition worse. Talk with your health care provider about any medicines that you are currently taking. °· Carefully follow a recommended diet as directed by your health care provider. °· Carefully follow instructions from your health care provider about fluid restrictions. °· Keep all follow-up visits as directed by your health care provider. This is important. °· Do not drink alcohol. °Contact a health care provider if: °· You develop worsening nausea, fatigue, headache, confusion, or weakness. °· Your symptoms go away and then return. °· You have problems following the recommended diet. °Get help right away if: °· You have a seizure. °· You faint. °· You have ongoing diarrhea or vomiting. °This information is not intended to replace advice given to you by your health care provider. Make sure you discuss any questions you have with your health care provider. °Document Released: 10/31/2002 Document Revised: 04/17/2016 Document Reviewed: 11/30/2014 °Elsevier Interactive Patient Education © 2018 [employer] ° °

## 2018-06-04 NOTE — Progress Notes (Signed)
Patient: Jesse Lara Male    DOB: 07/17/1966   52 y.o.   MRN: [id number] Visit Date: 06/04/2018  Today's Provider: Shirlee Latch, MD   I, Joslyn Hy, CMA, am acting as scribe for Shirlee Latch, MD.  Chief Complaint  Patient presents with  . Hypertension   Subjective:    HPI      Hypertension, follow-up:  BP Readings from Last 3 Encounters:  06/04/18 (!) 144/92  05/05/18 (!) 154/90  03/23/18 (!) 160/88    He was last seen for hypertension 1 months ago.  BP at that visit was 154/90. Management since that visit includes starting amlodipine 5 mg since D/C HCTZ. He reports good compliance with treatment. He is not having side effects.  He is not exercising. He is adherent to low salt diet.   Outside blood pressures are not being checked. He is experiencing slight headaches s/o concussion.  Patient denies chest pain, chest pressure/discomfort, claudication, dyspnea, exertional chest pressure/discomfort, fatigue, irregular heart beat, lower extremity edema, near-syncope, orthopnea, palpitations and syncope.   Cardiovascular risk factors include hypertension and male gender.  Use of agents associated with hypertension: none.     Weight trend: fluctuating a bit Wt Readings from Last 3 Encounters:  06/04/18 154 lb (69.9 kg)  05/05/18 146 lb (66.2 kg)  03/23/18 151 lb (68.5 kg)    Current diet: well balanced  ------------------------------------------------------------------------   Allergies  Allergen Reactions  . Codeine Nausea Only  . Hydrochlorothiazide Other (See Comments)    Hyponatremia     Current Outpatient Medications:  .  amLODipine (NORVASC) 5 MG tablet, Take 1 tablet (5 mg total) by mouth daily., Disp: 30 tablet, Rfl: 3 .  cetirizine (ZYRTEC) 10 MG tablet, Take 10 mg by mouth daily., Disp: , Rfl:  .  lisinopril (PRINIVIL,ZESTRIL) 40 MG tablet, Take 1 tablet (40 mg total) by mouth daily., Disp: 90 tablet, Rfl: 3 .  MULTIPLE  VITAMIN PO, Take by mouth., Disp: , Rfl:  .  OMEGA-3 FATTY ACIDS PO, Take by mouth., Disp: , Rfl:   Review of Systems  Constitutional: Negative for activity change, appetite change, chills, diaphoresis, fatigue, fever and unexpected weight change.  Respiratory: Negative for shortness of breath.   Cardiovascular: Negative for chest pain, palpitations and leg swelling.  Neurological: Positive for headaches. Negative for dizziness, syncope, weakness, light-headedness and numbness.    Social History   Tobacco Use  . Smoking status: Never Smoker  . Smokeless tobacco: Never Used  Substance Use Topics  . Alcohol use: No    Alcohol/week: 0.0 oz    Frequency: Never   Objective:   BP (!) 144/92 (BP Location: Left Arm, Patient Position: Sitting, Cuff Size: Normal)   Pulse 64   Temp 98.3 F (36.8 C) (Oral)   Resp 16   Wt 154 lb (69.9 kg)   SpO2 99%   BMI 24.12 kg/m  Vitals:   06/04/18 1450  BP: (!) 144/92  Pulse: 64  Resp: 16  Temp: 98.3 F (36.8 C)  TempSrc: Oral  SpO2: 99%  Weight: 154 lb (69.9 kg)     Physical Exam  Constitutional: He is oriented to person, place, and time. He appears well-developed and well-nourished. No distress.  HENT:  Head: Normocephalic and atraumatic.  Eyes: Conjunctivae are normal. No scleral icterus.  Neck: Neck supple. No thyromegaly present.  Cardiovascular: Normal rate, regular rhythm, normal heart sounds and intact distal pulses.  No murmur heard. Pulmonary/Chest: Effort normal and  breath sounds normal. No respiratory distress. He has no wheezes. He has no rales.  Musculoskeletal: He exhibits no edema.  Lymphadenopathy:    He has no cervical adenopathy.  Neurological: He is alert and oriented to person, place, and time.  Skin: Skin is warm and dry. Capillary refill takes less than 2 seconds.  Psychiatric: He has a normal mood and affect. His behavior is normal.  Vitals reviewed.      Assessment & Plan:   Problem List Items Addressed  This Visit      Cardiovascular and Mediastinum   BP (high blood pressure) - Primary    Remains uncontrolled Continue lisinopril Increase amlodipine to 10mg  daily Discussed goal BP <140/90, ideally <130/90 It is possiblethat BP will downtrend after he fully recoves from head injury per Cards F/u in 1 month      Relevant Medications   amLODipine (NORVASC) 10 MG tablet   Other Relevant Orders   Basic Metabolic Panel (BMET)     Other   Hyponatremia    2/2 HCTZ Has improved since stopping HCTZ Recheck BMP to ensure that it has resolved      Relevant Orders   Basic Metabolic Panel (BMET)       Return in about 1 month (around 07/02/2018) for BP f/u.   The entirety of the information documented in the History of Present Illness, Review of Systems and Physical Exam were personally obtained by me. Portions of this information were initially documented by Irving BurtonEmily Ratchford, CMA and reviewed by me for thoroughness and accuracy.    [name], Angela M, MD, MPH [facility] 06/04/2018 4:21 PM

## 2018-06-04 NOTE — Assessment & Plan Note (Signed)
2/2 HCTZ Has improved since stopping HCTZ Recheck BMP to ensure that it has resolved

## 2018-06-04 NOTE — Assessment & Plan Note (Signed)
Remains uncontrolled Continue lisinopril Increase amlodipine to 10mg  daily Discussed goal BP <140/90, ideally <130/90 It is possiblethat BP will downtrend after he fully recoves from head injury per Cards F/u in 1 month

## 2018-06-05 LAB — BASIC METABOLIC PANEL
BUN / CREAT RATIO: 10 (ref 9–20)
BUN: 8 mg/dL (ref 6–24)
CHLORIDE: 85 mmol/L — AB (ref 96–106)
CO2: 23 mmol/L (ref 20–29)
Calcium: 8.9 mg/dL (ref 8.7–10.2)
Creatinine, Ser: 0.77 mg/dL (ref 0.76–1.27)
GFR calc Af Amer: 122 mL/min/{1.73_m2} (ref >59)
GFR calc non Af Amer: 106 mL/min/{1.73_m2} (ref >59)
GLUCOSE: 83 mg/dL (ref 65–99)
POTASSIUM: 4 mmol/L (ref 3.5–5.2)
SODIUM: 122 mmol/L — AB (ref 134–144)

## 2018-06-07 ENCOUNTER — Encounter: Payer: Self-pay | Admitting: Family Medicine

## 2018-06-07 ENCOUNTER — Telehealth: Payer: Self-pay

## 2018-06-07 DIAGNOSIS — E871 Hypo-osmolality and hyponatremia: Secondary | ICD-10-CM

## 2018-06-07 DIAGNOSIS — I10 Essential (primary) hypertension: Secondary | ICD-10-CM

## 2018-06-07 NOTE — Telephone Encounter (Signed)
-----   Message from Erasmo DownerAngela M Bacigalupo, MD sent at 06/07/2018  2:08 PM EDT ----- Sodium has dropped again to 122.  Make sure he is definitely not taken HCTZ again.  Recommend Nephrology (kidney doctor) referral. If patient agrees, we can place referral for hyponatremia and hypertension.  I do question whether he could have secondary hypertension (some underlying cause).  [name], Angela M, MD, MPH [facility] 06/07/2018 2:05 PM

## 2018-06-07 NOTE — Telephone Encounter (Signed)
Referral placed  Aundre Hietala, Marzella SchleinAngela M, MD, MPH [facility] 06/07/2018 2:59 PM

## 2018-06-07 NOTE — Telephone Encounter (Signed)
Pt advised. He agrees to see the nephrologist.

## 2018-06-07 NOTE — Telephone Encounter (Signed)
lmtcb

## 2018-06-10 ENCOUNTER — Telehealth: Payer: Self-pay

## 2018-06-10 DIAGNOSIS — Z1211 Encounter for screening for malignant neoplasm of colon: Secondary | ICD-10-CM

## 2018-06-10 NOTE — Telephone Encounter (Signed)
Pt needs Cologuard ordered.

## 2018-06-16 ENCOUNTER — Encounter: Payer: Self-pay | Admitting: Family Medicine

## 2018-06-18 ENCOUNTER — Encounter: Payer: Self-pay | Admitting: Family Medicine

## 2018-06-29 LAB — COLOGUARD: Cologuard: NEGATIVE

## 2018-06-30 NOTE — Telephone Encounter (Signed)
Pt has been advised of this

## 2018-07-01 LAB — CBC AND DIFFERENTIAL
HEMATOCRIT: 45 (ref 41–53)
HEMOGLOBIN: 15 (ref 13.5–17.5)
NEUTROS ABS: 5
PLATELETS: 243 (ref 150–399)
WBC: 6.6

## 2018-07-01 LAB — HEPATIC FUNCTION PANEL
ALT: 44 — AB (ref 10–40)
AST: 34 (ref 14–40)
Alkaline Phosphatase: 79 (ref 25–125)
BILIRUBIN, TOTAL: 0.6

## 2018-07-01 LAB — LIPID PANEL
Cholesterol: 172 (ref 0–200)
HDL: 58 (ref 35–70)
LDL CALC: 100
Triglycerides: 77 (ref 40–160)

## 2018-07-01 LAB — BASIC METABOLIC PANEL
BUN: 11 (ref 4–21)
CREATININE: 0.8 (ref 0.6–1.3)
GLUCOSE: 99
POTASSIUM: 4.5 (ref 3.4–5.3)
Sodium: 136 — AB (ref 137–147)

## 2018-07-01 LAB — TSH: TSH: 2.8 (ref 0.41–5.90)

## 2018-07-05 ENCOUNTER — Ambulatory Visit: Payer: Self-pay | Admitting: Family Medicine

## 2018-07-07 ENCOUNTER — Encounter: Payer: Self-pay | Admitting: Family Medicine

## 2018-07-12 ENCOUNTER — Encounter: Payer: Self-pay | Admitting: Family Medicine

## 2018-07-12 ENCOUNTER — Other Ambulatory Visit: Payer: Self-pay

## 2018-07-12 ENCOUNTER — Ambulatory Visit (INDEPENDENT_AMBULATORY_CARE_PROVIDER_SITE_OTHER): Payer: BC Managed Care – PPO | Admitting: Family Medicine

## 2018-07-12 VITALS — BP 138/78 | HR 77 | Temp 98.0°F | Wt 151.0 lb

## 2018-07-12 DIAGNOSIS — I10 Essential (primary) hypertension: Secondary | ICD-10-CM | POA: Diagnosis not present

## 2018-07-12 MED ORDER — LISINOPRIL 40 MG PO TABS: 40.0000 mg | ORAL_TABLET | Freq: Every day | ORAL | 3 refills | Status: DC

## 2018-07-12 NOTE — Telephone Encounter (Signed)
Patient is [facility]

## 2018-07-12 NOTE — Assessment & Plan Note (Signed)
Well-controlled today Continue lisinopril and amlodipine at current doses Discussed goal blood pressure less than 140/90, ideally less than 130/90 Per cardiology, it is possible that blood pressure will downtrend after he fully recovers from his head injury He is also being followed by nephrology for his hyponatremia currently I will follow-up with him after his next nephrology appointment

## 2018-07-12 NOTE — Progress Notes (Signed)
Patient: Jesse Lara Male    DOB: 07/04/1966   52 y.o.   MRN: [id number] Visit Date: 07/12/2018  Today's Provider: Shirlee LatchAngela [name], MD   Chief Complaint  Patient presents with  . Hypertension   Subjective:    HPI I, Jesse Lara, CMA, am acting as a Neurosurgeonscribe for Jesse LatchAngela Jesse Corradi, MD.    Hypertension, follow-up:  BP Readings from Last 3 Encounters:  07/12/18 138/78  06/04/18 (!) 144/92  05/05/18 (!) 154/90    He was last seen for hypertension 1 months ago.  BP at that visit was 144/92.  Management changes since that visit include continue lisinopril and Increase amlodipine to 10mg  daily. He reports good compliance with treatment. He is not having side effects.  He is not exercising. He is adherent to low salt diet.   Outside blood pressures are being checked perioically. He states his BP was elevated [facility]. He is experiencing none.  Patient denies chest pain, chest pressure/discomfort, claudication, dyspnea, exertional chest pressure/discomfort, fatigue, irregular heart beat, lower extremity edema, near-syncope, orthopnea, palpitations, paroxysmal nocturnal dyspnea, syncope and tachypnea.   Cardiovascular risk factors include hypertension and male gender.  Use of agents associated with hypertension: none.     Weight trend: fluctuating a bit Wt Readings from Last 3 Encounters:  07/12/18 151 lb (68.5 kg)  06/04/18 154 lb (69.9 kg)  05/05/18 146 lb (66.2 kg)    Current diet: well balanced  [facility] hyponatremia and last sodium was back up to 136.   He has a f/u appt next month. ------------------------------------------------------------------------     Allergies  Allergen Reactions  . Codeine Nausea Only  . Hydrochlorothiazide Other (See Comments)    Hyponatremia     Current Outpatient Medications:  .  amLODipine (NORVASC) 10 MG tablet, Take 1 tablet (10 mg total) by mouth daily., Disp: 30  tablet, Rfl: 2 .  cetirizine (ZYRTEC) 10 MG tablet, Take 10 mg by mouth daily., Disp: , Rfl:  .  lisinopril (PRINIVIL,ZESTRIL) 40 MG tablet, Take 1 tablet (40 mg total) by mouth daily., Disp: 90 tablet, Rfl: 3 .  MULTIPLE VITAMIN PO, Take by mouth., Disp: , Rfl:  .  OMEGA-3 FATTY ACIDS PO, Take by mouth., Disp: , Rfl:   Review of Systems  Constitutional: Negative.   Respiratory: Negative.   Cardiovascular: Negative.   Gastrointestinal: Negative.   Genitourinary: Negative.   Neurological: Negative.   Psychiatric/Behavioral: Negative.     Social History   Tobacco Use  . Smoking status: Never Smoker  . Smokeless tobacco: Never Used  Substance Use Topics  . Alcohol use: No    Alcohol/week: 0.0 standard drinks    Frequency: Never   Objective:   BP 138/78 (BP Location: Right Arm, Patient Position: Sitting, Cuff Size: Normal)   Pulse 77   Temp 98 F (36.7 C) (Oral)   Wt 151 lb (68.5 kg)   SpO2 99%   BMI 23.65 kg/Lara  Vitals:   07/12/18 0820  BP: 138/78  Pulse: 77  Temp: 98 F (36.7 C)  TempSrc: Oral  SpO2: 99%  Weight: 151 lb (68.5 kg)     Physical Exam  Constitutional: He is oriented to person, place, and time. He appears well-developed and well-nourished. No distress.  HENT:  Head: Normocephalic and atraumatic.  Eyes: Conjunctivae are normal. No scleral icterus.  Cardiovascular: Normal rate, regular rhythm, normal heart sounds and intact distal pulses.  No murmur  heard. Pulmonary/Chest: Effort normal and breath sounds normal. No respiratory distress. He has no wheezes. He has no rales.  Musculoskeletal: He exhibits no edema.  Neurological: He is alert and oriented to person, place, and time.  Skin: Skin is warm and dry. Capillary refill takes less than 2 seconds. No rash noted.  Psychiatric: He has a normal mood and affect. His behavior is normal.  Vitals reviewed.      Assessment & Plan:   Problem List Items Addressed This Visit      Cardiovascular and  Mediastinum   BP (high blood pressure) - Primary    Well-controlled today Continue lisinopril and amlodipine at current doses Discussed goal blood pressure less than 140/90, ideally less than 130/90 Per cardiology, it is possible that blood pressure will downtrend after he fully recovers from his head injury He is also being followed by nephrology for his hyponatremia currently I will follow-up with him after his next nephrology appointment          Return in about 6 weeks (around 08/23/2018) for BP, sodium f/u.   The entirety of the information documented in the History of Present Illness, Review of Systems and Physical Exam were personally obtained by me. Portions of this information were initially documented by Jesse Lara, CMA and reviewed by me for thoroughness and accuracy.    Jesse Lara, Jesse Maxton M, MD, MPH [facility] 07/12/2018 10:01 AM

## 2018-07-26 ENCOUNTER — Encounter: Payer: Self-pay | Admitting: Family Medicine

## 2018-07-27 MED ORDER — LISINOPRIL 40 MG PO TABS: 40.0000 mg | ORAL_TABLET | Freq: Every day | ORAL | 3 refills | Status: DC

## 2018-08-20 ENCOUNTER — Encounter: Payer: Self-pay | Admitting: Family Medicine

## 2018-08-20 ENCOUNTER — Ambulatory Visit (INDEPENDENT_AMBULATORY_CARE_PROVIDER_SITE_OTHER): Payer: BC Managed Care – PPO | Admitting: Family Medicine

## 2018-08-20 VITALS — BP 116/70 | HR 88 | Temp 98.5°F | Wt 157.0 lb

## 2018-08-20 DIAGNOSIS — E871 Hypo-osmolality and hyponatremia: Secondary | ICD-10-CM

## 2018-08-20 DIAGNOSIS — I10 Essential (primary) hypertension: Secondary | ICD-10-CM

## 2018-08-20 NOTE — Assessment & Plan Note (Signed)
Well-controlled Continue lisinopril and amlodipine at current doses Reviewed recent BMP It is possible that blood pressure will start to down trend as he still recovering from his head injury He will monitor home blood pressures Discussed goal blood pressure less than 140/90, ideally less than 130/90 Discussed signs and symptoms of hypotension Follow-up in 6 months or sooner as needed

## 2018-08-20 NOTE — Assessment & Plan Note (Signed)
Resolved Likely secondary to over consumption of water and HCTZ Will likely be set to as needed visits with nephrology and will not need follow-up

## 2018-08-20 NOTE — Progress Notes (Signed)
Patient: Jesse Lara Male    DOB: [birth year]   52 y.o.   MRN: [id number] Visit Date: 08/20/2018  Today's Provider: Shirlee Latch, MD   Chief Complaint  Patient presents with  . Hypertension   Subjective:    I, [name], CMA, am acting as a Neurosurgeon for Shirlee Latch, MD.   HPI  Hypertension, follow-up:     BP Readings from Last 3 Encounters:  07/12/18 138/78  06/04/18 (!) 144/92  05/05/18 (!) 154/90    He was last seen for hypertension 1 month ago.  BP at that visit was 138/78.             Management changes since that visit include continue lisinopril and amlodipine 10mg  daily. He reports good compliance with treatment. He is not having side effects.  He is not exercising. He is adherent to low salt diet.   Outside blood pressures are being checked perioically.  He is experiencing none.  Patient denies chest pain, chest pressure/discomfort, claudication, dyspnea, exertional chest pressure/discomfort, fatigue, irregular heart beat, lower extremity edema, near-syncope, orthopnea, palpitations, paroxysmal nocturnal dyspnea, syncope and tachypnea.   Cardiovascular risk factors include hypertension and male gender.  Use of agents associated with hypertension: none.                Weight trend: fluctuating a bit    Wt Readings from Last 3 Encounters:  07/12/18 151 lb (68.5 kg)  06/04/18 154 lb (69.9 kg)  05/05/18 146 lb (66.2 kg)    Current diet: well balanced  Last sodium check on 07/01/2018 was back up to 136. On 08/20/2018 [facility] appt) sodium was 135.    Allergies  Allergen Reactions  . Codeine Nausea Only  . Hydrochlorothiazide Other (See Comments)    Hyponatremia     Current Outpatient Medications:  .  amLODipine (NORVASC) 10 MG tablet, Take 1 tablet (10 mg total) by mouth daily., Disp: 30 tablet, Rfl: 2 .  cetirizine (ZYRTEC) 10 MG tablet, Take 10 mg by mouth daily., Disp: , Rfl:  .  lisinopril (PRINIVIL,ZESTRIL) 40 MG  tablet, Take 1 tablet (40 mg total) by mouth daily., Disp: 90 tablet, Rfl: 3 .  MULTIPLE VITAMIN PO, Take by mouth., Disp: , Rfl:  .  OMEGA-3 FATTY ACIDS PO, Take by mouth., Disp: , Rfl:   Review of Systems  Constitutional: Negative.   Respiratory: Negative.   Cardiovascular: Negative.   Musculoskeletal: Negative.     Social History   Tobacco Use  . Smoking status: Never Smoker  . Smokeless tobacco: Never Used  Substance Use Topics  . Alcohol use: No    Alcohol/week: 0.0 standard drinks    Frequency: Never   Objective:   BP 116/70 (BP Location: Right Arm, Patient Position: Sitting, Cuff Size: Normal)   Pulse 88   Temp 98.5 F (36.9 C) (Oral)   Wt 157 lb (71.2 kg)   SpO2 97%   BMI 24.59 kg/m  Vitals:   08/20/18 0806  BP: 116/70  Pulse: 88  Temp: 98.5 F (36.9 C)  TempSrc: Oral  SpO2: 97%  Weight: 157 lb (71.2 kg)     Physical Exam  Constitutional: He is oriented to person, place, and time. He appears well-developed and well-nourished. No distress.  HENT:  Head: Normocephalic and atraumatic.  Mouth/Throat: Oropharynx is clear and moist.  Eyes: Conjunctivae are normal. No scleral icterus.  Neck: Neck supple. No thyromegaly present.  Cardiovascular: Normal rate, regular rhythm, normal heart sounds  and intact distal pulses.  No murmur heard. Pulmonary/Chest: Effort normal and breath sounds normal. No respiratory distress. He has no wheezes. He has no rales.  Musculoskeletal: He exhibits no edema or deformity.  Lymphadenopathy:    He has no cervical adenopathy.  Neurological: He is alert and oriented to person, place, and time.  Skin: Skin is warm and dry. Capillary refill takes less than 2 seconds.  Psychiatric: He has a normal mood and affect. His behavior is normal.  Vitals reviewed.       Assessment & Plan:   Problem List Items Addressed This Visit      Cardiovascular and Mediastinum   BP (high blood pressure) - Primary    Well-controlled Continue  lisinopril and amlodipine at current doses Reviewed recent BMP It is possible that blood pressure will start to down trend as he still recovering from his head injury He will monitor home blood pressures Discussed goal blood pressure less than 140/90, ideally less than 130/90 Discussed signs and symptoms of hypotension Follow-up in 6 months or sooner as needed        Other   Hyponatremia    Resolved Likely secondary to over consumption of water and HCTZ Will likely be set to as needed visits with nephrology and will not need follow-up          Return in about 6 months (around 02/18/2019) for BP f/u.   The entirety of the information documented in the History of Present Illness, Review of Systems and Physical Exam were personally obtained by me. Portions of this information were initially documented by [name], CMA and reviewed by me for thoroughness and accuracy.    Erasmo Downer, MD, MPH [facility] 08/20/2018 8:55 AM

## 2018-09-21 ENCOUNTER — Encounter: Payer: Self-pay | Admitting: Family Medicine

## 2018-09-21 MED ORDER — AMLODIPINE BESYLATE 10 MG PO TABS: 10.0000 mg | ORAL_TABLET | Freq: Every day | ORAL | 3 refills | Status: DC

## 2019-02-07 ENCOUNTER — Encounter: Payer: Self-pay | Admitting: Family Medicine

## 2019-02-17 NOTE — Progress Notes (Signed)
Patient: Jesse Lara Male    DOB: [birth year]   52 y.o.   MRN: [id number] Visit Date: 02/18/2019  Today's Provider: Shirlee Latch, MD   Chief Complaint  Patient presents with  . Hypertension   Subjective:    Virtual Visit via Video Note  I connected with Jesse Lara on 02/18/19 at  8:20 AM EDT by a video enabled telemedicine application and verified that I am speaking with the correct person using two identifiers.   I discussed the limitations of evaluation and management by telemedicine and the availability of in person appointments. The patient expressed understanding and agreed to proceed.  Patient location: home Provider location: [facility]   HPI  Hypertension, follow-up:  BP Readings from Last 3 Encounters:  02/18/19 128/76  08/20/18 116/70  07/12/18 138/78    He was last seen for hypertension 6 months ago.  BP at that visit was 116/70. Management changes since that visit include no change. He reports good compliance with treatment. He is not having side effects.  He is not exercising. He is adherent to low salt diet.   Outside blood pressures are well controlled. He is experiencing none.  Patient denies chest pain, chest pressure/discomfort, claudication, dyspnea, exertional chest pressure/discomfort, fatigue, irregular heart beat, lower extremity edema, near-syncope, orthopnea, palpitations, paroxysmal nocturnal dyspnea, syncope and tachypnea.   Cardiovascular risk factors include dyslipidemia, hypertension and male gender.  Use of agents associated with hypertension: none.    Weight trend: stable Wt Readings from Last 3 Encounters:  02/18/19 165 lb (74.8 kg)  08/20/18 157 lb (71.2 kg)  07/12/18 151 lb (68.5 kg)   Current diet: low salt  ------------------------------------------------------------------------ Waiting to see when he will have hernia surgery rescheduled due to pandemic.  Allergic rhinitis worsening in last few  weeks.  Taking Zyrtec and Flonase which helps   Allergies  Allergen Reactions  . Codeine Nausea Only  . Hydrochlorothiazide Other (See Comments)    Hyponatremia     Current Outpatient Medications:  .  amLODipine (NORVASC) 10 MG tablet, Take 1 tablet (10 mg total) by mouth daily., Disp: 90 tablet, Rfl: 3 .  cetirizine (ZYRTEC) 10 MG tablet, Take 10 mg by mouth daily., Disp: , Rfl:  .  lisinopril (PRINIVIL,ZESTRIL) 40 MG tablet, Take 1 tablet (40 mg total) by mouth daily., Disp: 90 tablet, Rfl: 3 .  MULTIPLE VITAMIN PO, Take by mouth., Disp: , Rfl:  .  OMEGA-3 FATTY ACIDS PO, Take by mouth., Disp: , Rfl:   Review of Systems  Constitutional: Negative.   Respiratory: Negative.   Cardiovascular: Negative.   Musculoskeletal: Negative.     Social History   Tobacco Use  . Smoking status: Never Smoker  . Smokeless tobacco: Never Used  Substance Use Topics  . Alcohol use: No    Alcohol/week: 0.0 standard drinks    Frequency: Never      Objective:   BP 128/76   Pulse 77   Wt 165 lb (74.8 kg)   BMI 25.84 kg/m  Vitals:   02/18/19 0832  BP: 128/76  Pulse: 77  Weight: 165 lb (74.8 kg)     Physical Exam Vitals signs reviewed.  Constitutional:      General: He is not in acute distress.    Appearance: Normal appearance. He is not diaphoretic.  HENT:     Head: Normocephalic and atraumatic.  Eyes:     General: No scleral icterus.    Conjunctiva/sclera: Conjunctivae normal.  Cardiovascular:     Rate and Rhythm: Normal rate.  Pulmonary:     Effort: Pulmonary effort is normal. No respiratory distress.  Musculoskeletal:     Right lower leg: No edema.     Left lower leg: No edema.  Neurological:     Mental Status: He is alert and oriented to person, place, and time. Mental status is at baseline.  Psychiatric:        Mood and Affect: Mood normal.        Behavior: Behavior normal.         Assessment & Plan       Problem List Items Addressed This Visit       Cardiovascular and Mediastinum   BP (high blood pressure) - Primary    Well controlled  continue amlodipine and lisinopril at current doses Will recheck BMP at next visit F/u in 6 months        Respiratory   Allergic rhinitis    Control improved Continue zyrtec and flonase          Return in about 6 months (around 08/21/2019) for CPE.   The entirety of the information documented in the History of Present Illness, Review of Systems and Physical Exam were personally obtained by me. Portions of this information were initially documented by [name], CMA and reviewed by me for thoroughness and accuracy.    I discussed the assessment and treatment plan with the patient. The patient was provided an opportunity to ask questions and all were answered. The patient agreed with the plan and demonstrated an understanding of the instructions.   The patient was advised to call back or seek an in-person evaluation if the symptoms worsen or if the condition fails to improve as anticipated.  I provided 15 minutes of non-face-to-face time during this encounter.   Erasmo Downer, MD, MPH [facility] 02/18/2019 8:44 AM

## 2019-02-18 ENCOUNTER — Encounter: Payer: Self-pay | Admitting: Family Medicine

## 2019-02-18 ENCOUNTER — Ambulatory Visit (INDEPENDENT_AMBULATORY_CARE_PROVIDER_SITE_OTHER): Payer: BC Managed Care – PPO | Admitting: Family Medicine

## 2019-02-18 VITALS — BP 128/76 | HR 77 | Wt 165.0 lb

## 2019-02-18 DIAGNOSIS — J301 Allergic rhinitis due to pollen: Secondary | ICD-10-CM | POA: Diagnosis not present

## 2019-02-18 DIAGNOSIS — I10 Essential (primary) hypertension: Secondary | ICD-10-CM | POA: Diagnosis not present

## 2019-02-18 NOTE — Assessment & Plan Note (Signed)
Control improved Continue zyrtec and flonase

## 2019-02-18 NOTE — Patient Instructions (Signed)

## 2019-02-18 NOTE — Assessment & Plan Note (Signed)
Well controlled  continue amlodipine and lisinopril at current doses Will recheck BMP at next visit F/u in 6 months

## 2019-07-15 ENCOUNTER — Other Ambulatory Visit: Payer: Self-pay | Admitting: Family Medicine

## 2019-07-16 ENCOUNTER — Encounter: Payer: Self-pay | Admitting: Family Medicine

## 2019-08-18 ENCOUNTER — Encounter: Payer: Self-pay | Admitting: Family Medicine

## 2019-08-18 NOTE — Telephone Encounter (Signed)
Go ahead and give patient a call to come in to be evaluated.  Need to do an exam to figure out what is going on.

## 2019-09-19 ENCOUNTER — Encounter: Payer: Self-pay | Admitting: Family Medicine

## 2019-09-20 ENCOUNTER — Other Ambulatory Visit: Payer: Self-pay | Admitting: Family Medicine

## 2019-10-05 ENCOUNTER — Other Ambulatory Visit: Payer: Self-pay | Admitting: Family Medicine

## 2019-10-07 ENCOUNTER — Encounter: Payer: Self-pay | Admitting: Family Medicine

## 2019-10-13 ENCOUNTER — Encounter: Payer: Self-pay | Admitting: Family Medicine

## 2019-10-13 ENCOUNTER — Ambulatory Visit: Payer: BC Managed Care – PPO | Admitting: Family Medicine

## 2019-10-13 ENCOUNTER — Other Ambulatory Visit: Payer: Self-pay

## 2019-10-13 VITALS — BP 126/82 | HR 84 | Temp 97.7°F | Resp 16 | Wt 165.0 lb

## 2019-10-13 DIAGNOSIS — E782 Mixed hyperlipidemia: Secondary | ICD-10-CM

## 2019-10-13 DIAGNOSIS — E663 Overweight: Secondary | ICD-10-CM | POA: Diagnosis not present

## 2019-10-13 DIAGNOSIS — I10 Essential (primary) hypertension: Secondary | ICD-10-CM

## 2019-10-13 MED ORDER — LISINOPRIL 40 MG PO TABS: 40.0000 mg | ORAL_TABLET | Freq: Every day | ORAL | 3 refills | Status: DC

## 2019-10-13 NOTE — Assessment & Plan Note (Signed)
Well controlled Continue current medications Recheck metabolic panel F/u in 6 months  

## 2019-10-13 NOTE — Assessment & Plan Note (Signed)
Not currently on a statin Recheck lipid panel and CMP Management pending ASCVD risk

## 2019-10-13 NOTE — Progress Notes (Signed)
Patient: Jesse Lara Male    DOB: [birth year]   53 y.o.   MRN: [id number] Visit Date: 10/13/2019  Today's Provider: Shirlee Latch, MD   Chief Complaint  Patient presents with  . Follow-up   Subjective:     HPI  Hypertension, follow-up:  BP Readings from Last 3 Encounters:  10/13/19 126/82  02/18/19 128/76  08/20/18 116/70    He was last seen for hypertension 6 months ago.  BP at that visit was 128/76. Management changes since that visit include no changes. He reports excellent compliance with treatment. He is not having side effects.  He is exercising. He is adherent to low salt diet.   Outside blood pressures are stable. He is experiencing none.  Patient denies chest pain and lower extremity edema.   Cardiovascular risk factors include hypertension.  Use of agents associated with hypertension: none.     Weight trend: stable Wt Readings from Last 3 Encounters:  10/13/19 165 lb (74.8 kg)  02/18/19 165 lb (74.8 kg)  08/20/18 157 lb (71.2 kg)    Current diet: in general, a "healthy" diet    ------------------------------------------------------------------------   Allergies  Allergen Reactions  . Codeine Nausea Only  . Hydrochlorothiazide Other (See Comments)    Hyponatremia     Current Outpatient Medications:  .  amLODipine (NORVASC) 10 MG tablet, Take 1 tablet by mouth once daily, Disp: 90 tablet, Rfl: 3 .  cetirizine (ZYRTEC) 10 MG tablet, Take 10 mg by mouth daily., Disp: , Rfl:  .  lisinopril (ZESTRIL) 40 MG tablet, Take 1 tablet by mouth once daily, Disp: 30 tablet, Rfl: 0 .  MULTIPLE VITAMIN PO, Take by mouth., Disp: , Rfl:  .  OMEGA-3 FATTY ACIDS PO, Take by mouth., Disp: , Rfl:   Review of Systems  Constitutional: Negative.   HENT: Negative.   Respiratory: Negative.   Cardiovascular: Negative.   Gastrointestinal: Negative.   Genitourinary: Negative.   Neurological: Negative.   Psychiatric/Behavioral: Negative.     Social  History   Tobacco Use  . Smoking status: Never Smoker  . Smokeless tobacco: Never Used  Substance Use Topics  . Alcohol use: No    Alcohol/week: 0.0 standard drinks    Frequency: Never      Objective:   BP 126/82 (BP Location: Left Arm, Patient Position: Sitting, Cuff Size: Normal)   Pulse 84   Temp 97.7 F (36.5 C) (Temporal)   Resp 16   Wt 165 lb (74.8 kg)   BMI 25.84 kg/m  Vitals:   10/13/19 0826  BP: 126/82  Pulse: 84  Resp: 16  Temp: 97.7 F (36.5 C)  TempSrc: Temporal  Weight: 165 lb (74.8 kg)  Body mass index is 25.84 kg/m.   Physical Exam Vitals signs reviewed.  Constitutional:      General: He is not in acute distress.    Appearance: Normal appearance. He is not diaphoretic.  HENT:     Head: Normocephalic and atraumatic.  Eyes:     General: No scleral icterus.    Conjunctiva/sclera: Conjunctivae normal.  Neck:     Musculoskeletal: Neck supple.  Cardiovascular:     Rate and Rhythm: Normal rate and regular rhythm.     Pulses: Normal pulses.     Heart sounds: Normal heart sounds. No murmur.  Pulmonary:     Effort: Pulmonary effort is normal. No respiratory distress.     Breath sounds: Normal breath sounds. No wheezing or rhonchi.  Abdominal:  General: There is no distension.     Palpations: Abdomen is soft.     Tenderness: There is no abdominal tenderness.  Musculoskeletal:     Right lower leg: No edema.     Left lower leg: No edema.  Lymphadenopathy:     Cervical: No cervical adenopathy.  Skin:    General: Skin is warm and dry.     Capillary Refill: Capillary refill takes less than 2 seconds.     Findings: No rash.  Neurological:     Mental Status: He is alert and oriented to person, place, and time.     Cranial Nerves: No cranial nerve deficit.  Psychiatric:        Mood and Affect: Mood normal.        Behavior: Behavior normal.      No results found for any visits on 10/13/19.     Assessment & Plan   Problem List Items  Addressed This Visit      Cardiovascular and Mediastinum   BP (high blood pressure) - Primary    Well controlled Continue current medications Recheck metabolic panel F/u in 6 months       Relevant Medications   lisinopril (ZESTRIL) 40 MG tablet   Other Relevant Orders   CMP (Comprehensive metabolic panel)   Lipid panel     Other   HLD (hyperlipidemia)    Not currently on a statin Recheck lipid panel and CMP Management pending ASCVD risk      Relevant Medications   lisinopril (ZESTRIL) 40 MG tablet   Other Relevant Orders   CMP (Comprehensive metabolic panel)   Lipid panel   Overweight    Discussed importance of healthy weight management Discussed diet and exercise Screening labs      Relevant Orders   CMP (Comprehensive metabolic panel)   Lipid panel       Return in about 6 months (around 04/11/2020) for CPE.   The entirety of the information documented in the History of Present Illness, Review of Systems and Physical Exam were personally obtained by me. Portions of this information were initially documented by Lynford Humphrey, CMA and reviewed by me for thoroughness and accuracy.    , Dionne Bucy, MD MPH [facility]

## 2019-10-13 NOTE — Assessment & Plan Note (Signed)
Discussed importance of healthy weight management Discussed diet and exercise  Screening labs 

## 2019-10-14 ENCOUNTER — Telehealth: Payer: Self-pay

## 2019-10-14 LAB — COMPREHENSIVE METABOLIC PANEL
ALT: 34 IU/L (ref 0–44)
AST: 25 IU/L (ref 0–40)
Albumin/Globulin Ratio: 2 (ref 1.2–2.2)
Albumin: 4.7 g/dL (ref 3.8–4.9)
Alkaline Phosphatase: 90 IU/L (ref 39–117)
BUN/Creatinine Ratio: 9 (ref 9–20)
BUN: 9 mg/dL (ref 6–24)
Bilirubin Total: 0.9 mg/dL (ref 0.0–1.2)
CO2: 21 mmol/L (ref 20–29)
Calcium: 9.6 mg/dL (ref 8.7–10.2)
Chloride: 98 mmol/L (ref 96–106)
Creatinine, Ser: 0.95 mg/dL (ref 0.76–1.27)
GFR calc Af Amer: 107 mL/min/{1.73_m2} (ref >59)
GFR calc non Af Amer: 92 mL/min/{1.73_m2} (ref >59)
Globulin, Total: 2.3 g/dL (ref 1.5–4.5)
Glucose: 100 mg/dL — ABNORMAL HIGH (ref 65–99)
Potassium: 4.4 mmol/L (ref 3.5–5.2)
Sodium: 134 mmol/L (ref 134–144)
Total Protein: 7 g/dL (ref 6.0–8.5)

## 2019-10-14 LAB — LIPID PANEL
Chol/HDL Ratio: 5.1 ratio — ABNORMAL HIGH (ref 0.0–5.0)
Cholesterol, Total: 190 mg/dL (ref 100–199)
HDL: 37 mg/dL — ABNORMAL LOW (ref >39)
LDL Chol Calc (NIH): 129 mg/dL — ABNORMAL HIGH (ref 0–99)
Triglycerides: 134 mg/dL (ref 0–149)
VLDL Cholesterol Cal: 24 mg/dL (ref 5–40)

## 2019-10-14 NOTE — Telephone Encounter (Signed)
-----   Message from Virginia Crews, MD sent at 10/14/2019 10:58 AM EST ----- Normal kidney function, liver function, electrolytes.  Cholesterol is slightly elevated, but no need for medications at this time.  I recommend diet low in saturated fat and regular exercise - 30 min at least 5 times per week

## 2019-10-14 NOTE — Telephone Encounter (Signed)
Viewed by Thomas Hoff on 10/14/2019 10:59 AM

## 2020-04-12 ENCOUNTER — Ambulatory Visit (INDEPENDENT_AMBULATORY_CARE_PROVIDER_SITE_OTHER): Payer: BC Managed Care – PPO | Admitting: Family Medicine

## 2020-04-12 ENCOUNTER — Encounter: Payer: Self-pay | Admitting: Family Medicine

## 2020-04-12 ENCOUNTER — Other Ambulatory Visit: Payer: Self-pay

## 2020-04-12 VITALS — BP 118/80 | HR 88 | Temp 96.8°F | Resp 16 | Ht 66.0 in | Wt 170.0 lb

## 2020-04-12 DIAGNOSIS — E782 Mixed hyperlipidemia: Secondary | ICD-10-CM | POA: Diagnosis not present

## 2020-04-12 DIAGNOSIS — I10 Essential (primary) hypertension: Secondary | ICD-10-CM

## 2020-04-12 DIAGNOSIS — E663 Overweight: Secondary | ICD-10-CM | POA: Diagnosis not present

## 2020-04-12 DIAGNOSIS — Z0000 Encounter for general adult medical examination without abnormal findings: Secondary | ICD-10-CM | POA: Diagnosis not present

## 2020-04-12 NOTE — Progress Notes (Signed)
I,Roshena L Chambers,acting as a scribe for Shirlee Latch, MD.,have documented all relevant documentation on the behalf of Shirlee Latch, MD,as directed by  Shirlee Latch, MD while in the presence of Shirlee Latch, MD.  Complete physical exam   Patient: Jesse Lara   DOB: 08-29-1966   54 y.o. Male  MRN: [id number] Visit Date: 04/12/2020  Today's healthcare provider: Shirlee Latch, MD   Chief Complaint  Patient presents with  . Annual Exam   Subjective    Jesse Lara is a 54 y.o. male who presents today for a complete physical exam.  He reports consuming a general diet. Home exercise routine includes walking daily. He generally feels well. He reports sleeping fairly well. He does not have additional problems to discuss today.  HPI  Hypertension: Patient report good compliance with treatment, and good tolerance.   Past Medical History:  Diagnosis Date  . Allergic rhinitis   . Hyperlipidemia   . Hypertension   . Rosacea    Past Surgical History:  Procedure Laterality Date  . GUM SURGERY    . VASECTOMY  2014   Social History   Socioeconomic History  . Marital status: Married    Spouse name: Not on file  . Number of children: Not on file  . Years of education: Not on file  . Highest education level: Not on file  Occupational History  . Not on file  Tobacco Use  . Smoking status: Never Smoker  . Smokeless tobacco: Never Used  Substance and Sexual Activity  . Alcohol use: No    Alcohol/week: 0.0 standard drinks  . Drug use: No  . Sexual activity: Not on file  Other Topics Concern  . Not on file  Social History Narrative  . Not on file   Social Determinants of Health   Financial Resource Strain:   . Difficulty of Paying Living Expenses:   Food Insecurity:   . Worried About Programme researcher, broadcasting/film/video in the Last Year:   . Barista in the Last Year:   Transportation Needs:   . Freight forwarder (Medical):   Marland Kitchen Lack of  Transportation (Non-Medical):   Physical Activity:   . Days of Exercise per Week:   . Minutes of Exercise per Session:   Stress:   . Feeling of Stress :   Social Connections:   . Frequency of Communication with Friends and Family:   . Frequency of Social Gatherings with Friends and Family:   . Attends Religious Services:   . Active Member of Clubs or Organizations:   . Attends Banker Meetings:   Marland Kitchen Marital Status:   Intimate Partner Violence:   . Fear of Current or Ex-Partner:   . Emotionally Abused:   Marland Kitchen Physically Abused:   . Sexually Abused:    Family Status  Relation Name Status  . Mother  Alive  . Father  Alive  . Daughter  Alive  . Son  Alive  . MGF  Deceased at age 41  . Daughter  Alive  . Daughter  Alive  . Sister  Alive  . Brother  Alive   Family History  Problem Relation Age of Onset  . Hypertension Mother   . Heart attack Maternal Grandfather    Allergies  Allergen Reactions  . Codeine Nausea Only  . Hydrochlorothiazide Other (See Comments)    Hyponatremia    Patient Care Team: Erasmo Downer, MD as PCP - General (Family  Medicine)   Medications: Outpatient Medications Prior to Visit  Medication Sig  . amLODipine (NORVASC) 10 MG tablet Take 1 tablet by mouth once daily  . cetirizine (ZYRTEC) 10 MG tablet Take 10 mg by mouth daily.  Marland Kitchen lisinopril (ZESTRIL) 40 MG tablet Take 1 tablet (40 mg total) by mouth daily.  . MULTIPLE VITAMIN PO Take by mouth.  . OMEGA-3 FATTY ACIDS PO Take by mouth.   No facility-administered medications prior to visit.    Review of Systems  Constitutional: Negative for appetite change, chills, fatigue and fever.  HENT: Negative for congestion, ear pain, hearing loss, nosebleeds and trouble swallowing.   Eyes: Negative for pain and visual disturbance.  Respiratory: Negative for cough, chest tightness and shortness of breath.   Cardiovascular: Negative for chest pain, palpitations and leg swelling.    Gastrointestinal: Negative for abdominal pain, blood in stool, constipation, diarrhea, nausea and vomiting.  Endocrine: Negative for polydipsia, polyphagia and polyuria.  Genitourinary: Negative for dysuria and flank pain.  Musculoskeletal: Negative for arthralgias, back pain, joint swelling, myalgias and neck stiffness.  Skin: Negative for color change, rash and wound.  Neurological: Negative for dizziness, tremors, seizures, speech difficulty, weakness, light-headedness and headaches.  Psychiatric/Behavioral: Negative for behavioral problems, confusion, decreased concentration, dysphoric mood and sleep disturbance. The patient is not nervous/anxious.   All other systems reviewed and are negative.     Objective    BP 118/80 (BP Location: Left Arm, Patient Position: Sitting, Cuff Size: Normal)   Pulse 88   Temp (!) 96.8 F (36 C) (Temporal)   Resp 16   Ht 5\' 6"  (1.676 m)   Wt 170 lb (77.1 kg)   SpO2 98% Comment: room air  BMI 27.44 kg/m    Physical Exam Constitutional:      General: He is not in acute distress.    Appearance: Normal appearance.  HENT:     Head: Normocephalic and atraumatic.     Right Ear: Tympanic membrane, ear canal and external ear normal.     Left Ear: Tympanic membrane, ear canal and external ear normal.     Nose: Nose normal.     Mouth/Throat:     Mouth: Mucous membranes are moist.     Pharynx: Oropharynx is clear.  Eyes:     General: No scleral icterus.    Conjunctiva/sclera: Conjunctivae normal.     Pupils: Pupils are equal, round, and reactive to light.  Cardiovascular:     Rate and Rhythm: Normal rate and regular rhythm.     Pulses: Normal pulses.     Heart sounds: Normal heart sounds. No murmur.  Pulmonary:     Effort: Pulmonary effort is normal. No respiratory distress.     Breath sounds: Normal breath sounds. No wheezing.  Abdominal:     General: There is no distension.     Palpations: Abdomen is soft.     Tenderness: There is no  abdominal tenderness.  Musculoskeletal:     Cervical back: Neck supple.     Right lower leg: No edema.     Left lower leg: No edema.  Lymphadenopathy:     Cervical: No cervical adenopathy.  Skin:    General: Skin is warm and dry.     Findings: No rash.  Neurological:     Mental Status: He is alert and oriented to person, place, and time. Mental status is at baseline.     Sensory: No sensory deficit.     Motor: No weakness.  Gait: Gait normal.  Psychiatric:        Mood and Affect: Mood normal.        Behavior: Behavior normal.       Depression Screen  PHQ 2/9 Scores 04/12/2020 10/13/2019 08/20/2018  PHQ - 2 Score 0 0 0  PHQ- 9 Score - - 0    No results found for any visits on 04/12/20.  Assessment & Plan    Routine Health Maintenance and Physical Exam  Exercise Activities and Dietary recommendations Goals   None     Immunization History  Administered Date(s) Administered  . Influenza Split 10/29/2010, 08/29/2011  . Influenza, Quadrivalent, Recombinant, Inj, Pf 10/06/2018  . Influenza,inj,Quad PF,6+ Mos 08/27/2015, 12/24/2017  . Influenza-Unspecified 10/06/2018  . Td 07/25/1998  . Tdap 08/29/2011    Health Maintenance  Topic Date Due  . HIV Screening  Never done  . INFLUENZA VACCINE  06/24/2020  . Fecal DNA (Cologuard)  06/17/2021  . TETANUS/TDAP  08/28/2021    Discussed health benefits of physical activity, and encouraged him to engage in regular exercise appropriate for his age and condition.  1. Annual physical exam Overall doing well today. Patient was encouraged to get a COVID Vaccine.  Patient declines HIV screening today. Will repeat labs and have him follow up in 6 months.  - Lipid panel - Comprehensive Metabolic Panel (CMET)  2. Essential hypertension Well controlled Continue current medications Recheck metabolic panel F/u in 6 months - Comprehensive Metabolic Panel (CMET)  3. Mixed hyperlipidemia Previously well controlled Continue  statin Repeat FLP and CMP Goal LDL < 130 - Lipid panel - Comprehensive Metabolic Panel (CMET)  4. Overweight Discussed importance of healthy weight management Discussed diet and exercise - Lipid panel - Comprehensive Metabolic Panel (CMET)  Return in about 6 months (around 10/13/2020).     I, Shirlee Latch, MD, have reviewed all documentation for this visit. The documentation on 04/12/20 for the exam, diagnosis, procedures, and orders are all accurate and complete.   Cordera Stineman, Marzella Schlein, MD, MPH [facility]

## 2020-04-12 NOTE — Patient Instructions (Signed)
Preventive Care 41-52 Years Old, Male Preventive care refers to lifestyle choices and visits with your health care provider that can promote health and wellness. This includes:  A yearly physical exam. This is also called an annual well check.  Regular dental and eye exams.  Immunizations.  Screening for certain conditions.  Healthy lifestyle choices, such as eating a healthy diet, getting regular exercise, not using drugs or products that contain nicotine and tobacco, and limiting alcohol use. What can I expect for my preventive care visit? Physical exam Your health care provider will check:  Height and weight. These may be used to calculate body mass index (BMI), which is a measurement that tells if you are at a healthy weight.  Heart rate and blood pressure.  Your skin for abnormal spots. Counseling Your health care provider may ask you questions about:  Alcohol, tobacco, and drug use.  Emotional well-being.  Home and relationship well-being.  Sexual activity.  Eating habits.  Work and work Statistician. What immunizations do I need?  Influenza (flu) vaccine  This is recommended every year. Tetanus, diphtheria, and pertussis (Tdap) vaccine  You may need a Td booster every 10 years. Varicella (chickenpox) vaccine  You may need this vaccine if you have not already been vaccinated. Zoster (shingles) vaccine  You may need this after age 64. Measles, mumps, and rubella (MMR) vaccine  You may need at least one dose of MMR if you were born in 1957 or later. You may also need a second dose. Pneumococcal conjugate (PCV13) vaccine  You may need this if you have certain conditions and were not previously vaccinated. Pneumococcal polysaccharide (PPSV23) vaccine  You may need one or two doses if you smoke cigarettes or if you have certain conditions. Meningococcal conjugate (MenACWY) vaccine  You may need this if you have certain conditions. Hepatitis A  vaccine  You may need this if you have certain conditions or if you travel or work in places where you may be exposed to hepatitis A. Hepatitis B vaccine  You may need this if you have certain conditions or if you travel or work in places where you may be exposed to hepatitis B. Haemophilus influenzae type b (Hib) vaccine  You may need this if you have certain risk factors. Human papillomavirus (HPV) vaccine  If recommended by your health care provider, you may need three doses over 6 months. You may receive vaccines as individual doses or as more than one vaccine together in one shot (combination vaccines). Talk with your health care provider about the risks and benefits of combination vaccines. What tests do I need? Blood tests  Lipid and cholesterol levels. These may be checked every 5 years, or more frequently if you are over 60 years old.  Hepatitis C test.  Hepatitis B test. Screening  Lung cancer screening. You may have this screening every year starting at age 43 if you have a 30-pack-year history of smoking and currently smoke or have quit within the past 15 years.  Prostate cancer screening. Recommendations will vary depending on your family history and other risks.  Colorectal cancer screening. All adults should have this screening starting at age 72 and continuing until age 2. Your health care provider may recommend screening at age 14 if you are at increased risk. You will have tests every 1-10 years, depending on your results and the type of screening test.  Diabetes screening. This is done by checking your blood sugar (glucose) after you have not eaten  for a while (fasting). You may have this done every 1-3 years.  Sexually transmitted disease (STD) testing. Follow these instructions at home: Eating and drinking  Eat a diet that includes fresh fruits and vegetables, whole grains, lean protein, and low-fat dairy products.  Take vitamin and mineral supplements as  recommended by your health care provider.  Do not drink alcohol if your health care provider tells you not to drink.  If you drink alcohol: ? Limit how much you have to 0-2 drinks a day. ? Be aware of how much alcohol is in your drink. In the [place] one drink equals one 12 oz bottle of beer (355 mL), one 5 oz glass of wine (148 mL), or one 1 oz glass of hard liquor (44 mL). Lifestyle  Take daily care of your teeth and gums.  Stay active. Exercise for at least 30 minutes on 5 or more days each week.  Do not use any products that contain nicotine or tobacco, such as cigarettes, e-cigarettes, and chewing tobacco. If you need help quitting, ask your health care provider.  If you are sexually active, practice safe sex. Use a condom or other form of protection to prevent STIs (sexually transmitted infections).  Talk with your health care provider about taking a low-dose aspirin every day starting at age 53. What's next?  Go to your health care provider once a year for a well check visit.  Ask your health care provider how often you should have your eyes and teeth checked.  Stay up to date on all vaccines. This information is not intended to replace advice given to you by your health care provider. Make sure you discuss any questions you have with your health care provider. Document Revised: 11/04/2018 Document Reviewed: 11/04/2018 Elsevier Patient Education  2020 [employer].

## 2020-04-13 ENCOUNTER — Telehealth: Payer: Self-pay

## 2020-04-13 LAB — COMPREHENSIVE METABOLIC PANEL
ALT: 40 IU/L (ref 0–44)
AST: 31 IU/L (ref 0–40)
Albumin/Globulin Ratio: 2 (ref 1.2–2.2)
Albumin: 4.9 g/dL (ref 3.8–4.9)
Alkaline Phosphatase: 85 IU/L (ref 48–121)
BUN/Creatinine Ratio: 8 — ABNORMAL LOW (ref 9–20)
BUN: 8 mg/dL (ref 6–24)
Bilirubin Total: 0.8 mg/dL (ref 0.0–1.2)
CO2: 23 mmol/L (ref 20–29)
Calcium: 9.5 mg/dL (ref 8.7–10.2)
Chloride: 99 mmol/L (ref 96–106)
Creatinine, Ser: 0.99 mg/dL (ref 0.76–1.27)
GFR calc Af Amer: 101 mL/min/{1.73_m2} (ref >59)
GFR calc non Af Amer: 87 mL/min/{1.73_m2} (ref >59)
Globulin, Total: 2.5 g/dL (ref 1.5–4.5)
Glucose: 103 mg/dL — ABNORMAL HIGH (ref 65–99)
Potassium: 4.3 mmol/L (ref 3.5–5.2)
Sodium: 137 mmol/L (ref 134–144)
Total Protein: 7.4 g/dL (ref 6.0–8.5)

## 2020-04-13 LAB — LIPID PANEL
Chol/HDL Ratio: 5.1 ratio — ABNORMAL HIGH (ref 0.0–5.0)
Cholesterol, Total: 198 mg/dL (ref 100–199)
HDL: 39 mg/dL — ABNORMAL LOW (ref >39)
LDL Chol Calc (NIH): 136 mg/dL — ABNORMAL HIGH (ref 0–99)
Triglycerides: 128 mg/dL (ref 0–149)
VLDL Cholesterol Cal: 23 mg/dL (ref 5–40)

## 2020-04-13 NOTE — Telephone Encounter (Signed)
-----   Message from Erasmo Downer, MD sent at 04/13/2020  8:16 AM EDT ----- Normal/stable labs.  The 10-year ASCVD (heart disease and stroke) risk score Denman George DC Jr., et al., 2013) is: 5.4%.  No need for statin medication at this time.  I recommend diet low in saturated fat and regular exercise - 30 min at least 5 times per week

## 2020-04-13 NOTE — Telephone Encounter (Signed)
[employer]   Result Notes and Comments to Patient Comment seen by patient Jesse Lara on 04/13/2020 8:50 AM EDT

## 2020-09-04 ENCOUNTER — Encounter: Payer: Self-pay | Admitting: Family Medicine

## 2020-09-04 NOTE — Telephone Encounter (Signed)
Ok to give 30 day supply of amlodipine Rx. Can someone reach out to him to discuss procedure for covid testing at our clinic? Thanks!

## 2020-09-06 ENCOUNTER — Encounter: Payer: Self-pay | Admitting: Family Medicine

## 2020-09-06 MED ORDER — AMLODIPINE BESYLATE 10 MG PO TABS: 10.0000 mg | ORAL_TABLET | Freq: Every day | ORAL | 0 refills | Status: DC

## 2020-10-15 ENCOUNTER — Ambulatory Visit: Payer: Self-pay | Admitting: Family Medicine

## 2020-10-22 ENCOUNTER — Encounter: Payer: Self-pay | Admitting: Family Medicine

## 2020-10-22 ENCOUNTER — Ambulatory Visit: Payer: BC Managed Care – PPO | Admitting: Family Medicine

## 2020-10-22 ENCOUNTER — Other Ambulatory Visit: Payer: Self-pay

## 2020-10-22 VITALS — BP 138/78 | HR 99 | Temp 98.3°F | Resp 16 | Wt 173.3 lb

## 2020-10-22 DIAGNOSIS — E663 Overweight: Secondary | ICD-10-CM | POA: Diagnosis not present

## 2020-10-22 DIAGNOSIS — I10 Essential (primary) hypertension: Secondary | ICD-10-CM

## 2020-10-22 DIAGNOSIS — J301 Allergic rhinitis due to pollen: Secondary | ICD-10-CM | POA: Diagnosis not present

## 2020-10-22 MED ORDER — LISINOPRIL 40 MG PO TABS: 40.0000 mg | ORAL_TABLET | Freq: Every day | ORAL | 3 refills | Status: DC

## 2020-10-22 MED ORDER — AMLODIPINE BESYLATE 10 MG PO TABS
10.0000 mg | ORAL_TABLET | Freq: Every day | ORAL | 3 refills | Status: DC
Start: 2020-10-22 — End: 2021-12-24

## 2020-10-22 NOTE — Assessment & Plan Note (Signed)
Well Controlled Continue Zyrtec and flonase Continue current plan

## 2020-10-22 NOTE — Assessment & Plan Note (Signed)
Discussed importance of healthy weight management  Discussed diet and exercise F/u 6 months

## 2020-10-22 NOTE — Assessment & Plan Note (Signed)
Well controlled on Amlodipine and Lisinopril Continue Current Meds Recheck BMP F/u 6 months

## 2020-10-22 NOTE — Progress Notes (Signed)
     Established patient visit   Patient: Jesse Lara   DOB: [birth year]   54 y.o. Male  MRN: [id number] Visit Date: 10/22/2020  Today's healthcare provider: Shirlee Latch, MD   Chief Complaint  Patient presents with  . Hypertension   Subjective    HPI   He is doing well, he has no current complaints. His diet is described as an 8/10. He exercises by walking one hour 6 times weekly. He is experiencing no symptoms. He is not interested in receiving the flu or covid vaccine after he had an adverse event several years ago.  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   Social History   Tobacco Use  . Smoking status: Never Smoker  . Smokeless tobacco: Never Used  Substance Use Topics  . Alcohol use: No    Alcohol/week: 0.0 standard drinks  . Drug use: No       Medications: Outpatient Medications Prior to Visit  Medication Sig  . cetirizine (ZYRTEC) 10 MG tablet Take 10 mg by mouth daily.  . MULTIPLE VITAMIN PO Take by mouth.  . OMEGA-3 FATTY ACIDS PO Take by mouth.  . [DISCONTINUED] amLODipine (NORVASC) 10 MG tablet Take 1 tablet (10 mg total) by mouth daily.  . [DISCONTINUED] lisinopril (ZESTRIL) 40 MG tablet Take 1 tablet (40 mg total) by mouth daily.   No facility-administered medications prior to visit.    Review of Systems  Constitutional: Negative.   Respiratory: Negative.   Cardiovascular: Negative.        Objective    BP 138/78 (BP Location: Left Arm, Patient Position: Sitting, Cuff Size: Normal)   Pulse 99   Temp 98.3 F (36.8 C) (Oral)   Resp 16   Wt 173 lb 4.8 oz (78.6 kg)   SpO2 99%   BMI 27.97 kg/m     Physical Exam   General: Well appearing, in NAD Pulm: Lungs CTA bilaterally Cards: RRR no murmurs, rubs or gallops Extremities: No peripheral edema.   Psych:  Affect: Euthymic  Conversations: Logical, Linear  Thought Process: Goal Oriented   No results found for any visits on 10/22/20.  Assessment & Plan       Problem List Items Addressed This Visit      Cardiovascular and Mediastinum   BP (high blood pressure) - Primary    Well controlled on Amlodipine and Lisinopril Continue Current Meds Recheck BMP F/u 6 months      Relevant Medications   lisinopril (ZESTRIL) 40 MG tablet   amLODipine (NORVASC) 10 MG tablet   Other Relevant Orders   Basic Metabolic Panel (BMET)     Respiratory   Allergic rhinitis    Well Controlled Continue Zyrtec and flonase Continue current plan        Other   Overweight    Discussed importance of healthy weight management  Discussed diet and exercise F/u 6 months          Return in about 6 months (around 04/21/2021) for CPE.      Patient seen along with MS3 student [facility]. I personally evaluated this patient along with the student, and verified all aspects of the history, physical exam, and medical decision making as documented by the student. I agree with the student's documentation and have made all necessary edits.  Jaelle Campanile, Marzella Schlein, MD, MPH [facility]

## 2020-10-23 LAB — BASIC METABOLIC PANEL
BUN/Creatinine Ratio: 11 (ref 9–20)
BUN: 10 mg/dL (ref 6–24)
CO2: 20 mmol/L (ref 20–29)
Calcium: 9.3 mg/dL (ref 8.7–10.2)
Chloride: 103 mmol/L (ref 96–106)
Creatinine, Ser: 0.89 mg/dL (ref 0.76–1.27)
GFR calc Af Amer: 114 mL/min/{1.73_m2} (ref >59)
GFR calc non Af Amer: 98 mL/min/{1.73_m2} (ref >59)
Glucose: 122 mg/dL — ABNORMAL HIGH (ref 65–99)
Potassium: 4.4 mmol/L (ref 3.5–5.2)
Sodium: 137 mmol/L (ref 134–144)

## 2020-10-24 ENCOUNTER — Telehealth: Payer: Self-pay

## 2020-10-24 NOTE — Telephone Encounter (Signed)
Written by Erasmo Downer, MD on 10/23/2020 8:14 AM EST Seen by patient Jesse Lara on 10/23/2020 6:00 PM

## 2020-10-24 NOTE — Telephone Encounter (Signed)
-----   Message from Erasmo Downer, MD sent at 10/23/2020  8:14 AM EST ----- Normal labs, except for slightly elevated glucose.  This could be normal if you were not fasting.

## 2020-10-29 ENCOUNTER — Encounter: Payer: Self-pay | Admitting: Family Medicine

## 2021-04-18 ENCOUNTER — Other Ambulatory Visit: Payer: Self-pay

## 2021-04-18 ENCOUNTER — Encounter: Payer: Self-pay | Admitting: Family Medicine

## 2021-04-18 ENCOUNTER — Ambulatory Visit (INDEPENDENT_AMBULATORY_CARE_PROVIDER_SITE_OTHER): Payer: BC Managed Care – PPO | Admitting: Family Medicine

## 2021-04-18 VITALS — BP 125/81 | HR 96 | Temp 98.2°F | Resp 16 | Ht 66.5 in | Wt 171.1 lb

## 2021-04-18 DIAGNOSIS — Z0000 Encounter for general adult medical examination without abnormal findings: Secondary | ICD-10-CM | POA: Diagnosis not present

## 2021-04-18 DIAGNOSIS — E782 Mixed hyperlipidemia: Secondary | ICD-10-CM | POA: Diagnosis not present

## 2021-04-18 DIAGNOSIS — Z1211 Encounter for screening for malignant neoplasm of colon: Secondary | ICD-10-CM

## 2021-04-18 DIAGNOSIS — I10 Essential (primary) hypertension: Secondary | ICD-10-CM

## 2021-04-18 DIAGNOSIS — E663 Overweight: Secondary | ICD-10-CM | POA: Diagnosis not present

## 2021-04-18 DIAGNOSIS — Z1159 Encounter for screening for other viral diseases: Secondary | ICD-10-CM

## 2021-04-18 NOTE — Assessment & Plan Note (Signed)
Discussed importance of healthy weight management Discussed diet and exercise  

## 2021-04-18 NOTE — Patient Instructions (Signed)

## 2021-04-18 NOTE — Assessment & Plan Note (Signed)
Reviewed last lipid panel Not currently on a statin Recheck FLP and CMP Discussed diet and exercise  

## 2021-04-18 NOTE — Progress Notes (Signed)
Complete physical exam   Patient: Jesse Lara   DOB: 09/02/1966   55 y.o. Male  MRN: [id number] Visit Date: 04/18/2021  Today's healthcare provider: Shirlee Latch, MD   Chief Complaint  Patient presents with  . Annual Exam   Subjective     HPI  Jesse Lara is a 55 y.o. male who presents today for a complete physical exam.  He reports consuming a general diet. Home exercise routine includes walking. He generally feels well. He reports sleeping fairly well. 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  Colon Cancer Test He agrees to wanting to receive the cologuard test in the mail.   Blood Pressure He questions if the 40mg  of Lisinopril for his blood pressure is appropriate for an extended period of time. He believes that once he retires he will be able to lower his dosage.   Vaccines He denies receiving the COVID vaccines.   Past Medical History:  Diagnosis Date  . Allergic rhinitis   . Hyperlipidemia   . Hypertension   . Rosacea    Past Surgical History:  Procedure Laterality Date  . GUM SURGERY    . VASECTOMY  2014   Social History   Socioeconomic History  . Marital status: Married    Spouse name: Not on file  . Number of children: Not on file  . Years of education: Not on file  . Highest education level: Not on file  Occupational History  . Not on file  Tobacco Use  . Smoking status: Never Smoker  . Smokeless tobacco: Never Used  Substance and Sexual Activity  . Alcohol use: No    Alcohol/week: 0.0 standard drinks  . Drug use: No  . Sexual activity: Not on file  Other Topics Concern  . Not on file  Social History Narrative  . Not on file   Social Determinants of Health   Financial Resource Strain: Not on file  Food Insecurity: Not on file  Transportation Needs: Not on file  Physical Activity: Not on file  Stress: Not on file  Social  Connections: Not on file  Intimate Partner Violence: Not on file   Family Status  Relation Name Status  . Mother  Alive  . Father  Alive  . Daughter  Alive  . Son  Alive  . MGF  Deceased at age 79  . Daughter  Alive  . Daughter  Alive  . Sister  Alive  . Brother  Alive   Family History  Problem Relation Age of Onset  . Hypertension Mother   . Heart attack Maternal Grandfather    Allergies  Allergen Reactions  . Codeine Nausea Only  . Hydrochlorothiazide Other (See Comments)    Hyponatremia    Patient Care Team: 69, MD as PCP - General (Family Medicine)   Medications: Outpatient Medications Prior to Visit  Medication Sig  . amLODipine (NORVASC) 10 MG tablet Take 1 tablet (10 mg total) by mouth daily.  . cetirizine (ZYRTEC) 10 MG tablet Take 10 mg by mouth daily.  Erasmo Downer lisinopril (ZESTRIL) 40 MG tablet Take 1 tablet (40 mg total) by mouth daily.  . MULTIPLE VITAMIN PO Take by mouth.  . OMEGA-3 FATTY ACIDS PO Take by mouth.  . SOOLANTRA 1 % CREA Apply topically 2 (two) times daily.   No facility-administered medications prior to visit.  Review of Systems  Constitutional: Negative for chills, fatigue and fever.  HENT: Negative for ear pain, nosebleeds, rhinorrhea, sinus pressure, sinus pain and sore throat.   Eyes: Negative for pain and visual disturbance.  Respiratory: Negative for cough, chest tightness, shortness of breath and wheezing.   Cardiovascular: Negative for chest pain, palpitations and leg swelling.  Gastrointestinal: Negative for abdominal pain, blood in stool, constipation, diarrhea, nausea and vomiting.  Genitourinary: Negative for dysuria, flank pain, frequency, hematuria and urgency.  Musculoskeletal: Negative for back pain, neck pain and neck stiffness.  Neurological: Negative for dizziness, seizures, syncope, weakness, light-headedness, numbness and headaches.  All other systems reviewed and are negative.     Objective    BP  125/81   Pulse 96   Temp 98.2 F (36.8 C) (Oral)   Resp 16   Ht 5' 6.5" (1.689 m)   Wt 171 lb 1.6 oz (77.6 kg)   SpO2 98%   BMI 27.20 kg/m    Physical Exam Vitals reviewed.  Constitutional:      General: He is not in acute distress.    Appearance: Normal appearance. He is well-developed. He is not diaphoretic.  HENT:     Head: Normocephalic and atraumatic.  Eyes:     General: No scleral icterus.    Conjunctiva/sclera: Conjunctivae normal.     Pupils: Pupils are equal, round, and reactive to light.  Neck:     Thyroid: No thyromegaly.  Cardiovascular:     Rate and Rhythm: Normal rate and regular rhythm.     Pulses: Normal pulses.     Heart sounds: Normal heart sounds. No murmur heard.   Pulmonary:     Effort: Pulmonary effort is normal. No respiratory distress.     Breath sounds: Normal breath sounds. No wheezing or rales.  Abdominal:     General: There is no distension.     Palpations: Abdomen is soft.     Tenderness: There is no abdominal tenderness.  Musculoskeletal:        General: No deformity.     Cervical back: Neck supple.     Right lower leg: No edema.     Left lower leg: No edema.  Lymphadenopathy:     Cervical: No cervical adenopathy.  Skin:    General: Skin is warm and dry.     Findings: No rash.  Neurological:     Mental Status: He is alert and oriented to person, place, and time. Mental status is at baseline.     Sensory: No sensory deficit.     Motor: No weakness.     Gait: Gait normal.  Psychiatric:        Mood and Affect: Mood normal.        Behavior: Behavior normal.        Thought Content: Thought content normal.       Last depression screening scores PHQ 2/9 Scores 04/18/2021 10/22/2020 04/12/2020  PHQ - 2 Score 0 0 0  PHQ- 9 Score 0 0 -   Last fall risk screening Fall Risk  04/18/2021  Falls in the past year? 0  Number falls in past yr: 0  Injury with Fall? 0  Risk for fall due to : -  Follow up -   Last Audit-C alcohol use  screening Alcohol Use Disorder Test (AUDIT) 04/18/2021  1. How often do you have a drink containing alcohol? 0  2. How many drinks containing alcohol do you have on a typical day when you are  drinking? 0  3. How often do you have six or more drinks on one occasion? 0  AUDIT-C Score 0  Alcohol Brief Interventions/Follow-up -   A score of 3 or more in women, and 4 or more in men indicates increased risk for alcohol abuse, EXCEPT if all of the points are from question 1   No results found for any visits on 04/18/21.  Assessment & Plan     Problem List Items Addressed This Visit      Cardiovascular and Mediastinum   BP (high blood pressure)    Well controlled Continue current medications Recheck metabolic panel F/u in 6 months       Relevant Orders   Comprehensive metabolic panel     Other   HLD (hyperlipidemia)    Reviewed last lipid panel Not currently on a statin Recheck FLP and CMP Discussed diet and exercise       Relevant Orders   Comprehensive metabolic panel   Lipid panel   Overweight    Discussed importance of healthy weight management Discussed diet and exercise        Other Visit Diagnoses    Encounter for annual physical exam    -  Primary   Relevant Orders   Comprehensive metabolic panel   Lipid panel   Cologuard   Hepatitis C Antibody   Need for hepatitis C screening test       Relevant Orders   Hepatitis C Antibody   Colon cancer screening       Relevant Orders   Cologuard      Routine Health Maintenance and Physical Exam  Exercise Activities and Dietary recommendations Goals   None     Immunization History  Administered Date(s) Administered  . Influenza Split 10/29/2010, 08/29/2011  . Influenza, Quadrivalent, Recombinant, Inj, Pf 10/06/2018  . Influenza,inj,Quad PF,6+ Mos 08/27/2015, 12/24/2017  . Influenza-Unspecified 10/06/2018  . Td 07/25/1998  . Tdap 08/29/2011    Health Maintenance  Topic Date Due  . HIV Screening  Never  done  . Hepatitis C Screening  Never done  . COVID-19 Vaccine (1) 10/15/2021 (Originally 02/22/1971)  . Fecal DNA (Cologuard)  06/17/2021  . INFLUENZA VACCINE  06/24/2021  . TETANUS/TDAP  08/28/2021  . HPV VACCINES  Aged Out    Discussed health benefits of physical activity, and encouraged him to engage in regular exercise appropriate for his age and condition.    Return in about 6 months (around 10/19/2021) for chronic disease f/u.      I,Essence Turner,acting as a Neurosurgeon for Shirlee Latch, MD.,have documented all relevant documentation on the behalf of Shirlee Latch, MD,as directed by  Shirlee Latch, MD while in the presence of Shirlee Latch, MD.  I, Shirlee Latch, MD, have reviewed all documentation for this visit. The documentation on 04/18/21 for the exam, diagnosis, procedures, and orders are all accurate and complete.   Jesse Lara, Marzella Schlein, MD, MPH [facility]

## 2021-04-18 NOTE — Assessment & Plan Note (Signed)
Well controlled Continue current medications Recheck metabolic panel F/u in 6 months  

## 2021-04-19 LAB — COMPREHENSIVE METABOLIC PANEL
ALT: 51 IU/L — ABNORMAL HIGH (ref 0–44)
AST: 36 IU/L (ref 0–40)
Albumin/Globulin Ratio: 2.2 (ref 1.2–2.2)
Albumin: 5 g/dL — ABNORMAL HIGH (ref 3.8–4.9)
Alkaline Phosphatase: 86 IU/L (ref 44–121)
BUN/Creatinine Ratio: 9 (ref 9–20)
BUN: 9 mg/dL (ref 6–24)
Bilirubin Total: 0.8 mg/dL (ref 0.0–1.2)
CO2: 25 mmol/L (ref 20–29)
Calcium: 9.8 mg/dL (ref 8.7–10.2)
Chloride: 100 mmol/L (ref 96–106)
Creatinine, Ser: 0.97 mg/dL (ref 0.76–1.27)
Globulin, Total: 2.3 g/dL (ref 1.5–4.5)
Glucose: 103 mg/dL — ABNORMAL HIGH (ref 65–99)
Potassium: 5 mmol/L (ref 3.5–5.2)
Sodium: 137 mmol/L (ref 134–144)
Total Protein: 7.3 g/dL (ref 6.0–8.5)
eGFR: 93 mL/min/{1.73_m2} (ref >59)

## 2021-04-19 LAB — LIPID PANEL
Chol/HDL Ratio: 4.1 ratio (ref 0.0–5.0)
Cholesterol, Total: 176 mg/dL (ref 100–199)
HDL: 43 mg/dL (ref >39)
LDL Chol Calc (NIH): 113 mg/dL — ABNORMAL HIGH (ref 0–99)
Triglycerides: 110 mg/dL (ref 0–149)
VLDL Cholesterol Cal: 20 mg/dL (ref 5–40)

## 2021-04-19 LAB — HEPATITIS C ANTIBODY: Hep C Virus Ab: <0.1 s/co ratio (ref 0.0–0.9)

## 2021-08-02 ENCOUNTER — Encounter: Payer: Self-pay | Admitting: Family Medicine

## 2021-08-17 LAB — COLOGUARD: Cologuard: NEGATIVE

## 2021-08-18 ENCOUNTER — Encounter: Payer: Self-pay | Admitting: Family Medicine

## 2021-08-21 LAB — EXTERNAL GENERIC LAB PROCEDURE: COLOGUARD: NEGATIVE

## 2021-08-21 LAB — COLOGUARD: COLOGUARD: NEGATIVE

## 2021-10-20 ENCOUNTER — Encounter: Payer: Self-pay | Admitting: Family Medicine

## 2021-10-24 ENCOUNTER — Ambulatory Visit: Payer: BC Managed Care – PPO | Admitting: Family Medicine

## 2021-12-23 NOTE — Progress Notes (Signed)
Established patient visit   Patient: Jesse Lara   DOB: [birth year]   56 y.o. Male  MRN: [id number] Visit Date: 12/24/2021  Today's healthcare provider: Lavon Paganini, MD   Chief Complaint  Patient presents with   Follow-up   Subjective    HPI  Hypertension, follow-up  BP Readings from Last 3 Encounters:  12/24/21 136/87  04/18/21 125/81  10/22/20 138/78   Wt Readings from Last 3 Encounters:  12/24/21 177 lb 8 oz (80.5 kg)  04/18/21 171 lb 1.6 oz (77.6 kg)  10/22/20 173 lb 4.8 oz (78.6 kg)     He was last seen for hypertension 6 months ago.  BP at that visit was 125/81. Management since that visit includes continue current treatment plan. Blood Pressures have been running normal to loww. Patient stopped the Amlodipine and was advised to cut Lisinopril 3 months ago in half and continue to monitor BP reading at home.  He reports excellent compliance with treatment. He is not having side effects.  He is following a Regular diet. He is exercising.  Symptoms: No chest pain No chest pressure  No palpitations No syncope  No dyspnea No orthopnea  No paroxysmal nocturnal dyspnea No lower extremity edema   Pertinent labs: Lab Results  Component Value Date   CHOL 176 04/18/2021   HDL 43 04/18/2021   LDLCALC 113 (H) 04/18/2021   TRIG 110 04/18/2021   CHOLHDL 4.1 04/18/2021   Lab Results  Component Value Date   NA 137 04/18/2021   K 5.0 04/18/2021   CREATININE 0.97 04/18/2021   EGFR 93 04/18/2021   GLUCOSE 103 (H) 04/18/2021   TSH 2.80 07/01/2018     The ASCVD Risk score (Arnett DK, et al., 2019) failed to calculate for the following reasons:   Unable to determine if patient is Non-Hispanic African American   ---------------------------------------------------------------------------------------------------   Medications: Outpatient Medications Prior to Visit  Medication Sig   cetirizine (ZYRTEC) 10 MG tablet Take 10 mg by mouth daily.   MULTIPLE  VITAMIN PO Take by mouth.   OMEGA-3 FATTY ACIDS PO Take by mouth.   SOOLANTRA 1 % CREA Apply topically 2 (two) times daily.   [DISCONTINUED] lisinopril (ZESTRIL) 40 MG tablet Take 1 tablet (40 mg total) by mouth daily.   [DISCONTINUED] amLODipine (NORVASC) 10 MG tablet Take 1 tablet (10 mg total) by mouth daily.   No facility-administered medications prior to visit.    Review of Systems per HPI     Objective    BP 136/87 (BP Location: Left Arm, Patient Position: Sitting, Cuff Size: Normal)    Pulse 83    Temp 98.4 F (36.9 C) (Oral)    Resp 16    Ht _0  (1.702 m)    Wt 177 lb 8 oz (80.5 kg)    BMI 27.80 kg/m  {Show previous vital signs (optional):23777}  Physical Exam Vitals reviewed.  Constitutional:      General: He is not in acute distress.    Appearance: Normal appearance. He is not diaphoretic.  HENT:     Head: Normocephalic and atraumatic.  Eyes:     General: No scleral icterus.    Conjunctiva/sclera: Conjunctivae normal.  Cardiovascular:     Rate and Rhythm: Normal rate and regular rhythm.     Pulses: Normal pulses.     Heart sounds: Normal heart sounds. No murmur heard. Pulmonary:     Effort: Pulmonary effort is normal. No respiratory distress.  Breath sounds: Normal breath sounds. No wheezing or rhonchi.  Abdominal:     General: There is no distension.     Palpations: Abdomen is soft.     Tenderness: There is no abdominal tenderness.  Musculoskeletal:     Cervical back: Neck supple.     Right lower leg: No edema.     Left lower leg: No edema.  Lymphadenopathy:     Cervical: No cervical adenopathy.  Skin:    General: Skin is warm and dry.     Capillary Refill: Capillary refill takes less than 2 seconds.     Findings: No rash.  Neurological:     Mental Status: He is alert and oriented to person, place, and time.     Cranial Nerves: No cranial nerve deficit.  Psychiatric:        Mood and Affect: Mood normal.        Behavior: Behavior normal.       No results found for any visits on 12/24/21.  Assessment & Plan     Problem List Items Addressed This Visit       Cardiovascular and Mediastinum   BP (high blood pressure) - Primary    Well controlled Continue current medications Recheck metabolic panel F/u in 6 months       Relevant Medications   lisinopril (ZESTRIL) 20 MG tablet   Other Relevant Orders   Basic Metabolic Panel (BMET)     Other   Overweight    Discussed importance of healthy weight management Discussed diet and exercise       Hyperglycemia    Recommend low carb diet Check A1c       Relevant Orders   Hemoglobin A1c   Other Visit Diagnoses     Prostate cancer screening       Relevant Orders   PSA Total (Reflex To Free)        Return in about 6 months (around 06/23/2022) for CPE.      I, Lavon Paganini, MD, have reviewed all documentation for this visit. The documentation on 12/24/21 for the exam, diagnosis, procedures, and orders are all accurate and complete.   Juliocesar Blasius, Dionne Bucy, MD, MPH [facility]

## 2021-12-24 ENCOUNTER — Other Ambulatory Visit: Payer: Self-pay

## 2021-12-24 ENCOUNTER — Encounter: Payer: Self-pay | Admitting: Family Medicine

## 2021-12-24 ENCOUNTER — Ambulatory Visit: Payer: BC Managed Care – PPO | Admitting: Family Medicine

## 2021-12-24 VITALS — BP 136/87 | HR 83 | Temp 98.4°F | Resp 16 | Ht 67.0 in | Wt 177.5 lb

## 2021-12-24 DIAGNOSIS — R739 Hyperglycemia, unspecified: Secondary | ICD-10-CM | POA: Diagnosis not present

## 2021-12-24 DIAGNOSIS — E663 Overweight: Secondary | ICD-10-CM | POA: Diagnosis not present

## 2021-12-24 DIAGNOSIS — Z125 Encounter for screening for malignant neoplasm of prostate: Secondary | ICD-10-CM | POA: Diagnosis not present

## 2021-12-24 DIAGNOSIS — I10 Essential (primary) hypertension: Secondary | ICD-10-CM

## 2021-12-24 MED ORDER — LISINOPRIL 20 MG PO TABS: 20.0000 mg | ORAL_TABLET | Freq: Every day | ORAL | 3 refills | Status: DC

## 2021-12-24 NOTE — Assessment & Plan Note (Signed)
Well controlled Continue current medications Recheck metabolic panel F/u in 6 months  

## 2021-12-24 NOTE — Assessment & Plan Note (Signed)
Discussed importance of healthy weight management Discussed diet and exercise  

## 2021-12-24 NOTE — Assessment & Plan Note (Signed)
Recommend low carb diet Check A1c  

## 2021-12-25 LAB — HEMOGLOBIN A1C
Est. average glucose Bld gHb Est-mCnc: 120 mg/dL
Hgb A1c MFr Bld: 5.8 % — ABNORMAL HIGH (ref 4.8–5.6)

## 2021-12-25 LAB — PSA TOTAL (REFLEX TO FREE): Prostate Specific Ag, Serum: 1.3 ng/mL (ref 0.0–4.0)

## 2021-12-25 LAB — BASIC METABOLIC PANEL
BUN/Creatinine Ratio: 13 (ref 9–20)
BUN: 11 mg/dL (ref 6–24)
CO2: 23 mmol/L (ref 20–29)
Calcium: 9.7 mg/dL (ref 8.7–10.2)
Chloride: 100 mmol/L (ref 96–106)
Creatinine, Ser: 0.86 mg/dL (ref 0.76–1.27)
Glucose: 97 mg/dL (ref 70–99)
Potassium: 4.6 mmol/L (ref 3.5–5.2)
Sodium: 140 mmol/L (ref 134–144)
eGFR: 104 mL/min/{1.73_m2} (ref >59)

## 2022-06-26 ENCOUNTER — Encounter: Payer: BC Managed Care – PPO | Admitting: Family Medicine

## 2022-07-03 ENCOUNTER — Encounter: Payer: BC Managed Care – PPO | Admitting: Physician Assistant

## 2022-07-03 NOTE — Progress Notes (Signed)
Complete physical exam  I,Joseline E Rosas,acting as a scribe for [employer], PA-C.,have documented all relevant documentation on the behalf of Alfredia Ferguson, PA-C,as directed by  Alfredia Ferguson, PA-C while in the presence of Alfredia Ferguson, PA-C.   Patient: Jesse Lara   DOB: 10/29/1966   56 y.o. Male  MRN: [id number] Visit Date: 07/04/2022  Today's healthcare provider: Alfredia Ferguson, PA-C   Chief Complaint  Patient presents with   Annual Exam   Subjective    Jesse Lara is a 55 y.o. male who presents today for a complete physical exam.  He reports consuming a general diet. Home exercise routine includes walks about an hour. He generally feels well. He reports sleeping well. He does not have additional problems to discuss today.  HPI  He reports intermittent pain on the bottom of his right foot in the morning that after he walks around a bit goes away. Reports taking walks on gravel roads frequently.    Past Medical History:  Diagnosis Date   Allergic rhinitis    Hyperlipidemia    Hypertension    Rosacea    Past Surgical History:  Procedure Laterality Date   GUM SURGERY     VASECTOMY  2014   Social History   Socioeconomic History   Marital status: Married    Spouse name: Not on file   Number of children: Not on file   Years of education: Not on file   Highest education level: Not on file  Occupational History   Not on file  Tobacco Use   Smoking status: Never   Smokeless tobacco: Never  Substance and Sexual Activity   Alcohol use: No    Alcohol/week: 0.0 standard drinks of alcohol   Drug use: No   Sexual activity: Not on file  Other Topics Concern   Not on file  Social History Narrative   Not on file   Social Determinants of Health   Financial Resource Strain: Not on file  Food Insecurity: Not on file  Transportation Needs: Not on file  Physical Activity: Not on file  Stress: Not on file  Social Connections: Not on file  Intimate  Partner Violence: Not on file   Family Status  Relation Name Status   Mother  Alive   Father  Alive   Daughter  Alive   Son  Alive   MGF  Deceased at age 70   Daughter  Alive   Daughter  Alive   Sister  Alive   Brother  Alive   Family History  Problem Relation Age of Onset   Hypertension Mother    Heart attack Maternal Grandfather    Allergies  Allergen Reactions   Codeine Nausea Only   Hydrochlorothiazide Other (See Comments)    Hyponatremia    Patient Care Team: Erasmo Downer, MD as PCP - General (Family Medicine)   Medications: Outpatient Medications Prior to Visit  Medication Sig   cetirizine (ZYRTEC) 10 MG tablet Take 10 mg by mouth daily.   lisinopril (ZESTRIL) 20 MG tablet Take 1 tablet (20 mg total) by mouth daily.   MULTIPLE VITAMIN PO Take by mouth.   OMEGA-3 FATTY ACIDS PO Take by mouth.   SOOLANTRA 1 % CREA Apply topically 2 (two) times daily.   No facility-administered medications prior to visit.    Review of Systems  Genitourinary:  Positive for frequency.  All other systems reviewed and are negative.     Objective  BP 130/89 (BP Location: Left Arm, Patient Position: Sitting, Cuff Size: Normal)   Pulse 95   Temp 98.3 F (36.8 C) (Oral)   Resp 16   Ht 5\' 7"  (1.702 m)   Wt 185 lb 12.8 oz (84.3 kg)   BMI 29.10 kg/m     Physical Exam Constitutional:      General: He is awake.     Appearance: He is well-developed.  HENT:     Head: Normocephalic.     Right Ear: Tympanic membrane, ear canal and external ear normal.     Left Ear: Tympanic membrane, ear canal and external ear normal.     Nose: Nose normal. No congestion or rhinorrhea.     Mouth/Throat:     Mouth: Mucous membranes are moist.     Pharynx: No oropharyngeal exudate or posterior oropharyngeal erythema.  Eyes:     Pupils: Pupils are equal, round, and reactive to light.  Cardiovascular:     Rate and Rhythm: Normal rate and regular rhythm.     Heart sounds: Normal  heart sounds.  Pulmonary:     Effort: Pulmonary effort is normal.     Breath sounds: Normal breath sounds.  Abdominal:     General: There is no distension.     Palpations: Abdomen is soft.     Tenderness: There is no abdominal tenderness. There is no guarding.  Musculoskeletal:     Cervical back: Normal range of motion.     Right lower leg: No edema.     Left lower leg: No edema.  Lymphadenopathy:     Cervical: No cervical adenopathy.  Skin:    General: Skin is warm.  Neurological:     Mental Status: He is alert and oriented to person, place, and time.  Psychiatric:        Attention and Perception: Attention normal.        Mood and Affect: Mood normal.        Speech: Speech normal.        Behavior: Behavior normal. Behavior is cooperative.     Last depression screening scores    07/04/2022    1:44 PM 04/18/2021    9:10 AM 10/22/2020    8:25 AM  PHQ 2/9 Scores  PHQ - 2 Score 0 0 0  PHQ- 9 Score  0 0   Last fall risk screening    07/04/2022    1:44 PM  Fall Risk   Falls in the past year? 0  Number falls in past yr: 0  Injury with Fall? 0   Last Audit-C alcohol use screening    04/18/2021    9:09 AM  Alcohol Use Disorder Test (AUDIT)  1. How often do you have a drink containing alcohol? 0  2. How many drinks containing alcohol do you have on a typical day when you are drinking? 0  3. How often do you have six or more drinks on one occasion? 0  AUDIT-C Score 0   A score of 3 or more in women, and 4 or more in men indicates increased risk for alcohol abuse, EXCEPT if all of the points are from question 1   No results found for any visits on 07/04/22.  Assessment & Plan    Routine Health Maintenance and Physical Exam  Exercise Activities and Dietary recommendations --balanced diet high in fiber and protein, low in sugars, carbs, fats. --physical activity/exercise 30 minutes 3-5 times a week     Immunization History  Administered Date(s) Administered    Influenza Split 10/29/2010, 08/29/2011   Influenza, Quadrivalent, Recombinant, Inj, Pf 10/06/2018   Influenza,inj,Quad PF,6+ Mos 08/27/2015, 12/24/2017   Influenza-Unspecified 10/06/2018   Td 07/25/1998   Tdap 08/29/2011    Health Maintenance  Topic Date Due   COVID-19 Vaccine (1) Never done   HIV Screening  Never done   INFLUENZA VACCINE  06/24/2022   Zoster Vaccines- Shingrix (1 of 2) 10/04/2022 (Originally 02/22/2018)   TETANUS/TDAP  12/24/2022 (Originally 08/28/2021)   Fecal DNA (Cologuard)  08/17/2024   Hepatitis C Screening  Completed   HPV VACCINES  Aged Out    Discussed health benefits of physical activity, and encouraged him to engage in regular exercise appropriate for his age and condition.  Problem List Items Addressed This Visit       Cardiovascular and Mediastinum   BP (high blood pressure)    Chronic and well controlled Reviewed last cmp Continue lisinopril 20 mg F/u 6 mo        Musculoskeletal and Integument   Plantar fasciitis, right    Advised rest, ice, stretching/rolling exercises        Other   HLD (hyperlipidemia)    Not currently medicated Ordered fasting lipids      Relevant Orders   Lipid panel   Comprehensive Metabolic Panel (CMET)   Prediabetes    Historically, will recheck today Reviewed diet and exercise      Relevant Orders   HgB A1c   Other Visit Diagnoses     Annual physical exam    -  Primary        Return in about 6 months (around 01/04/2023) for chronic conditions.     I, Alfredia Ferguson, PA-C have reviewed all documentation for this visit. The documentation on 07/04/2022 for the exam, diagnosis, procedures, and orders are all accurate and complete.Alfredia Ferguson, PA-C [facility] [address] [place], [place], [address] Office: [phone] Fax: [phone]   [facility]

## 2022-07-04 ENCOUNTER — Ambulatory Visit (INDEPENDENT_AMBULATORY_CARE_PROVIDER_SITE_OTHER): Payer: BC Managed Care – PPO | Admitting: Physician Assistant

## 2022-07-04 ENCOUNTER — Encounter: Payer: Self-pay | Admitting: Physician Assistant

## 2022-07-04 VITALS — BP 130/89 | HR 95 | Temp 98.3°F | Resp 16 | Ht 67.0 in | Wt 185.8 lb

## 2022-07-04 DIAGNOSIS — I10 Essential (primary) hypertension: Secondary | ICD-10-CM

## 2022-07-04 DIAGNOSIS — Z0000 Encounter for general adult medical examination without abnormal findings: Secondary | ICD-10-CM

## 2022-07-04 DIAGNOSIS — E782 Mixed hyperlipidemia: Secondary | ICD-10-CM

## 2022-07-04 DIAGNOSIS — M722 Plantar fascial fibromatosis: Secondary | ICD-10-CM

## 2022-07-04 DIAGNOSIS — R7303 Prediabetes: Secondary | ICD-10-CM | POA: Insufficient documentation

## 2022-07-04 NOTE — Assessment & Plan Note (Signed)
Not currently medicated Ordered fasting lipids

## 2022-07-04 NOTE — Assessment & Plan Note (Signed)
Advised rest, ice, stretching/rolling exercises

## 2022-07-04 NOTE — Assessment & Plan Note (Signed)
Chronic and well controlled Reviewed last cmp Continue lisinopril 20 mg F/u 6 mo

## 2022-07-04 NOTE — Assessment & Plan Note (Addendum)
Historically, will recheck today Reviewed diet and exercise

## 2022-07-12 LAB — COMPREHENSIVE METABOLIC PANEL
ALT: 64 IU/L — ABNORMAL HIGH (ref 0–44)
AST: 45 IU/L — ABNORMAL HIGH (ref 0–40)
Albumin/Globulin Ratio: 1.8 (ref 1.2–2.2)
Albumin: 4.4 g/dL (ref 3.8–4.9)
Alkaline Phosphatase: 84 IU/L (ref 44–121)
BUN/Creatinine Ratio: 12 (ref 9–20)
BUN: 11 mg/dL (ref 6–24)
Bilirubin Total: 1 mg/dL (ref 0.0–1.2)
CO2: 23 mmol/L (ref 20–29)
Calcium: 9.2 mg/dL (ref 8.7–10.2)
Chloride: 101 mmol/L (ref 96–106)
Creatinine, Ser: 0.95 mg/dL (ref 0.76–1.27)
Globulin, Total: 2.4 g/dL (ref 1.5–4.5)
Glucose: 105 mg/dL — ABNORMAL HIGH (ref 70–99)
Potassium: 4.4 mmol/L (ref 3.5–5.2)
Sodium: 139 mmol/L (ref 134–144)
Total Protein: 6.8 g/dL (ref 6.0–8.5)
eGFR: 95 mL/min/{1.73_m2} (ref >59)

## 2022-07-12 LAB — LIPID PANEL
Chol/HDL Ratio: 5.2 ratio — ABNORMAL HIGH (ref 0.0–5.0)
Cholesterol, Total: 188 mg/dL (ref 100–199)
HDL: 36 mg/dL — ABNORMAL LOW (ref >39)
LDL Chol Calc (NIH): 125 mg/dL — ABNORMAL HIGH (ref 0–99)
Triglycerides: 149 mg/dL (ref 0–149)
VLDL Cholesterol Cal: 27 mg/dL (ref 5–40)

## 2022-07-12 LAB — HEMOGLOBIN A1C
Est. average glucose Bld gHb Est-mCnc: 117 mg/dL
Hgb A1c MFr Bld: 5.7 % — ABNORMAL HIGH (ref 4.8–5.6)

## 2022-07-15 ENCOUNTER — Other Ambulatory Visit: Payer: Self-pay

## 2022-07-15 DIAGNOSIS — R748 Abnormal levels of other serum enzymes: Secondary | ICD-10-CM

## 2022-08-09 LAB — COMPREHENSIVE METABOLIC PANEL
ALT: 52 IU/L — ABNORMAL HIGH (ref 0–44)
AST: 31 IU/L (ref 0–40)
Albumin/Globulin Ratio: 1.9 (ref 1.2–2.2)
Albumin: 4.4 g/dL (ref 3.8–4.9)
Alkaline Phosphatase: 80 IU/L (ref 44–121)
BUN/Creatinine Ratio: 13 (ref 9–20)
BUN: 12 mg/dL (ref 6–24)
Bilirubin Total: 0.8 mg/dL (ref 0.0–1.2)
CO2: 21 mmol/L (ref 20–29)
Calcium: 9.1 mg/dL (ref 8.7–10.2)
Chloride: 102 mmol/L (ref 96–106)
Creatinine, Ser: 0.95 mg/dL (ref 0.76–1.27)
Globulin, Total: 2.3 g/dL (ref 1.5–4.5)
Glucose: 101 mg/dL — ABNORMAL HIGH (ref 70–99)
Potassium: 4.4 mmol/L (ref 3.5–5.2)
Sodium: 138 mmol/L (ref 134–144)
Total Protein: 6.7 g/dL (ref 6.0–8.5)
eGFR: 95 mL/min/{1.73_m2} (ref >59)

## 2022-11-24 ENCOUNTER — Other Ambulatory Visit: Payer: Self-pay | Admitting: Family Medicine

## 2023-01-08 NOTE — Progress Notes (Signed)
I,Sulibeya S Dimas,acting as a Education administrator for [employer], PA-C.,have documented all relevant documentation on the behalf of Mikey Kirschner, PA-C,as directed by  Mikey Kirschner, PA-C while in the presence of Mikey Kirschner, PA-C.     Established patient visit   Patient: Jesse Lara   DOB: [birth year]   57 y.o. Male  MRN: [id number] Visit Date: 01/09/2023  Today's healthcare provider: Mikey Kirschner, PA-C   Chief Complaint  Patient presents with   Hypertension   Hyperglycemia   Subjective    HPI  Hypertension, follow-up  BP Readings from Last 3 Encounters:  01/09/23 125/85  07/04/22 130/89  12/24/21 136/87   Wt Readings from Last 3 Encounters:  01/09/23 187 lb 8 oz (85 kg)  07/04/22 185 lb 12.8 oz (84.3 kg)  12/24/21 177 lb 8 oz (80.5 kg)     He was last seen for hypertension 6 months ago.  BP at that visit was 130/89. Management since that visit includes Continue lisinopril 20 mg .  He reports excellent compliance with treatment.  Outside blood pressures are not being checked.   Pertinent labs Lab Results  Component Value Date   CHOL 188 07/11/2022   HDL 36 (L) 07/11/2022   LDLCALC 125 (H) 07/11/2022   TRIG 149 07/11/2022   CHOLHDL 5.2 (H) 07/11/2022   Lab Results  Component Value Date   NA 138 08/08/2022   K 4.4 08/08/2022   CREATININE 0.95 08/08/2022   EGFR 95 08/08/2022   GLUCOSE 101 (H) 08/08/2022   TSH 2.80 07/01/2018     The ASCVD Risk score (Arnett DK, et al., 2019) failed to calculate for the following reasons:   Unable to determine if patient is Non-Hispanic African American  Prediabetes, Follow-up  Lab Results  Component Value Date   HGBA1C 6.1 (A) 01/09/2023   HGBA1C 5.7 (H) 07/11/2022   HGBA1C 5.8 (H) 12/24/2021   GLUCOSE 101 (H) 08/08/2022   GLUCOSE 105 (H) 07/11/2022   GLUCOSE 97 12/24/2021    Last seen for for this6 months ago.  Management since that visit includes reviewed diet and exercise . Current symptoms include none  and have been stable.  Medications: Outpatient Medications Prior to Visit  Medication Sig   cetirizine (ZYRTEC) 10 MG tablet Take 10 mg by mouth daily.   lisinopril (ZESTRIL) 20 MG tablet Take 1 tablet by mouth once daily   MULTIPLE VITAMIN PO Take by mouth.   OMEGA-3 FATTY ACIDS PO Take by mouth.   SOOLANTRA 1 % CREA Apply topically 2 (two) times daily.   No facility-administered medications prior to visit.    Review of Systems  Constitutional:  Negative for appetite change and fatigue.  Eyes:  Negative for visual disturbance.  Respiratory:  Negative for chest tightness and shortness of breath.   Cardiovascular:  Negative for chest pain and leg swelling.  Neurological:  Negative for dizziness, light-headedness and headaches.       Objective    BP 125/85 (BP Location: Right Arm, Patient Position: Sitting, Cuff Size: Large)   Pulse 83   Temp 97.6 F (36.4 C) (Temporal)   Resp 16   Wt 187 lb 8 oz (85 kg)   BMI 29.37 kg/m   Physical Exam Vitals reviewed.  Constitutional:      Appearance: He is not ill-appearing.  HENT:     Head: Normocephalic.  Eyes:     Conjunctiva/sclera: Conjunctivae normal.  Cardiovascular:     Rate and Rhythm: Normal rate.  Pulmonary:  Effort: Pulmonary effort is normal. No respiratory distress.  Neurological:     General: No focal deficit present.     Mental Status: He is alert and oriented to person, place, and time.  Psychiatric:        Mood and Affect: Mood normal.        Behavior: Behavior normal.     Results for orders placed or performed in visit on 01/09/23  POCT glycosylated hemoglobin (Hb A1C)  Result Value Ref Range   Hemoglobin A1C 6.1 (A) 4.0 - 5.6 %   Est. average glucose Bld gHb Est-mCnc 128     Assessment & Plan     Problem List Items Addressed This Visit       Cardiovascular and Mediastinum   Hypertension    Stable, chronic. Well controlled with lisinopril 20 mg F/u 6 mo         Other   Prediabetes -  Primary    Still prediabetic; 6 mo ago 5.7% today 6.1%  Lengthy discussion of diet/exercise changes.       Relevant Orders   POCT glycosylated hemoglobin (Hb A1C) (Completed)    Return in about 6 months (around 07/10/2023) for CPE.      I, Mikey Kirschner, PA-C have reviewed all documentation for this visit. The documentation on  01/09/23 for the exam, diagnosis, procedures, and orders are all accurate and complete.  Mikey Kirschner, PA-C [facility] [address] [place], [place], [address] Office: [phone] Fax: [place]

## 2023-01-09 ENCOUNTER — Encounter: Payer: Self-pay | Admitting: Physician Assistant

## 2023-01-09 ENCOUNTER — Ambulatory Visit: Payer: BC Managed Care – PPO | Admitting: Physician Assistant

## 2023-01-09 VITALS — BP 125/85 | HR 83 | Temp 97.6°F | Resp 16 | Wt 187.5 lb

## 2023-01-09 DIAGNOSIS — I10 Essential (primary) hypertension: Secondary | ICD-10-CM | POA: Diagnosis not present

## 2023-01-09 DIAGNOSIS — R7303 Prediabetes: Secondary | ICD-10-CM | POA: Diagnosis not present

## 2023-01-09 LAB — POCT GLYCOSYLATED HEMOGLOBIN (HGB A1C)
Est. average glucose Bld gHb Est-mCnc: 128
Hemoglobin A1C: 6.1 % — AB (ref 4.0–5.6)

## 2023-01-09 NOTE — Assessment & Plan Note (Signed)
Still prediabetic; 6 mo ago 5.7% today 6.1%  Lengthy discussion of diet/exercise changes.

## 2023-01-09 NOTE — Assessment & Plan Note (Signed)
Stable, chronic. Well controlled with lisinopril 20 mg F/u 6 mo

## 2023-02-21 ENCOUNTER — Other Ambulatory Visit: Payer: Self-pay | Admitting: Family Medicine

## 2023-02-27 ENCOUNTER — Encounter: Payer: Self-pay | Admitting: Physician Assistant

## 2023-05-28 ENCOUNTER — Other Ambulatory Visit: Payer: Self-pay | Admitting: Physician Assistant

## 2023-06-15 ENCOUNTER — Telehealth: Payer: Self-pay | Admitting: Physician Assistant

## 2023-07-10 ENCOUNTER — Ambulatory Visit: Payer: BC Managed Care – PPO | Admitting: Physician Assistant
